# Patient Record
Sex: Male | Born: 1945 | Race: Black or African American | Hispanic: No | Smoking: Former smoker
Health system: Southern US, Community
[De-identification: ages and names within clinical notes are randomized; demographics above are authoritative.]

## PROBLEM LIST (undated history)

## (undated) DIAGNOSIS — N138 Other obstructive and reflux uropathy: Secondary | ICD-10-CM

## (undated) DIAGNOSIS — I10 Essential (primary) hypertension: Secondary | ICD-10-CM

## (undated) DIAGNOSIS — N401 Enlarged prostate with lower urinary tract symptoms: Secondary | ICD-10-CM

## (undated) DIAGNOSIS — C61 Malignant neoplasm of prostate: Secondary | ICD-10-CM

## (undated) DIAGNOSIS — C801 Malignant (primary) neoplasm, unspecified: Secondary | ICD-10-CM

## (undated) DIAGNOSIS — Z86010 Personal history of colonic polyps: Secondary | ICD-10-CM

## (undated) DIAGNOSIS — K219 Gastro-esophageal reflux disease without esophagitis: Secondary | ICD-10-CM

## (undated) DIAGNOSIS — M1990 Unspecified osteoarthritis, unspecified site: Secondary | ICD-10-CM

## (undated) HISTORY — DX: Personal history of colonic polyps: Z86.010

## (undated) HISTORY — DX: Essential (primary) hypertension: I10

## (undated) HISTORY — DX: Gastro-esophageal reflux disease without esophagitis: K21.9

## (undated) HISTORY — PX: PROSTATE BIOPSY: SHX241

## (undated) HISTORY — PX: MULTIPLE TOOTH EXTRACTIONS: SHX2053

## (undated) HISTORY — DX: Malignant (primary) neoplasm, unspecified: C80.1

## (undated) HISTORY — PX: TONSILLECTOMY AND ADENOIDECTOMY: SUR1326

## (undated) HISTORY — DX: Benign prostatic hyperplasia with lower urinary tract symptoms: N13.8

## (undated) HISTORY — DX: Benign prostatic hyperplasia with lower urinary tract symptoms: N40.1

---

## 2000-07-16 ENCOUNTER — Encounter: Admission: RE | Admit: 2000-07-16 | Discharge: 2000-07-16 | Payer: Self-pay | Admitting: Family Medicine

## 2000-11-19 ENCOUNTER — Encounter: Admission: RE | Admit: 2000-11-19 | Discharge: 2000-11-19 | Payer: Self-pay | Admitting: Family Medicine

## 2000-12-22 ENCOUNTER — Encounter: Admission: RE | Admit: 2000-12-22 | Discharge: 2000-12-22 | Payer: Self-pay | Admitting: Family Medicine

## 2001-05-06 ENCOUNTER — Encounter: Admission: RE | Admit: 2001-05-06 | Discharge: 2001-05-06 | Payer: Self-pay | Admitting: Family Medicine

## 2006-07-28 ENCOUNTER — Emergency Department (HOSPITAL_COMMUNITY): Admission: EM | Admit: 2006-07-28 | Discharge: 2006-07-28 | Payer: Self-pay | Admitting: Emergency Medicine

## 2007-04-03 ENCOUNTER — Emergency Department (HOSPITAL_COMMUNITY): Admission: EM | Admit: 2007-04-03 | Discharge: 2007-04-03 | Payer: Self-pay | Admitting: *Deleted

## 2007-07-01 ENCOUNTER — Emergency Department (HOSPITAL_COMMUNITY): Admission: EM | Admit: 2007-07-01 | Discharge: 2007-07-01 | Payer: Self-pay | Admitting: Emergency Medicine

## 2007-08-12 ENCOUNTER — Ambulatory Visit: Payer: Self-pay | Admitting: Family Medicine

## 2007-08-12 ENCOUNTER — Encounter (INDEPENDENT_AMBULATORY_CARE_PROVIDER_SITE_OTHER): Payer: Self-pay | Admitting: Family Medicine

## 2007-08-12 DIAGNOSIS — N138 Other obstructive and reflux uropathy: Secondary | ICD-10-CM | POA: Insufficient documentation

## 2007-08-12 DIAGNOSIS — N401 Enlarged prostate with lower urinary tract symptoms: Secondary | ICD-10-CM

## 2007-08-12 DIAGNOSIS — I10 Essential (primary) hypertension: Secondary | ICD-10-CM

## 2007-08-12 LAB — CONVERTED CEMR LAB
ALT: 19 units/L (ref 0–53)
AST: 15 units/L (ref 0–37)
Albumin: 4.4 g/dL (ref 3.5–5.2)
Alkaline Phosphatase: 79 units/L (ref 39–117)
BUN: 8 mg/dL (ref 6–23)
CO2: 28 meq/L (ref 19–32)
Calcium: 9.1 mg/dL (ref 8.4–10.5)
Chloride: 106 meq/L (ref 96–112)
Cholesterol: 172 mg/dL (ref 0–200)
Creatinine, Ser: 1.03 mg/dL (ref 0.40–1.50)
Glucose, Bld: 95 mg/dL (ref 70–99)
HDL: 49 mg/dL (ref >39)
LDL Cholesterol: 112 mg/dL — ABNORMAL HIGH (ref 0–99)
PSA: 5.24 ng/mL — ABNORMAL HIGH (ref 0.10–4.00)
PSA: ABNORMAL ng/mL
Potassium: 4.5 meq/L (ref 3.5–5.3)
Sodium: 143 meq/L (ref 135–145)
Total Bilirubin: 0.6 mg/dL (ref 0.3–1.2)
Total CHOL/HDL Ratio: 3.5
Total Protein: 6.3 g/dL (ref 6.0–8.3)
Triglycerides: 53 mg/dL (ref <150)
VLDL: 11 mg/dL (ref 0–40)

## 2007-09-16 ENCOUNTER — Ambulatory Visit (HOSPITAL_COMMUNITY): Admission: RE | Admit: 2007-09-16 | Discharge: 2007-09-16 | Payer: Self-pay | Admitting: Family Medicine

## 2007-09-16 ENCOUNTER — Encounter (INDEPENDENT_AMBULATORY_CARE_PROVIDER_SITE_OTHER): Payer: Self-pay | Admitting: Family Medicine

## 2007-09-16 ENCOUNTER — Ambulatory Visit: Payer: Self-pay | Admitting: Family Medicine

## 2007-09-16 LAB — CONVERTED CEMR LAB
BUN: 8 mg/dL (ref 6–23)
CO2: 25 meq/L (ref 19–32)
Calcium: 9.3 mg/dL (ref 8.4–10.5)
Chloride: 104 meq/L (ref 96–112)
Cholesterol, target level: 200 mg/dL
Creatinine, Ser: 1.01 mg/dL (ref 0.40–1.50)
Glucose, Bld: 88 mg/dL (ref 70–99)
HDL goal, serum: 40 mg/dL
LDL Goal: 130 mg/dL
Potassium: 3.7 meq/L (ref 3.5–5.3)
Sodium: 142 meq/L (ref 135–145)

## 2007-09-21 ENCOUNTER — Telehealth (INDEPENDENT_AMBULATORY_CARE_PROVIDER_SITE_OTHER): Payer: Self-pay | Admitting: Family Medicine

## 2007-09-23 ENCOUNTER — Telehealth (INDEPENDENT_AMBULATORY_CARE_PROVIDER_SITE_OTHER): Payer: Self-pay | Admitting: *Deleted

## 2007-09-28 ENCOUNTER — Encounter (INDEPENDENT_AMBULATORY_CARE_PROVIDER_SITE_OTHER): Payer: Self-pay | Admitting: Family Medicine

## 2007-10-24 ENCOUNTER — Encounter (INDEPENDENT_AMBULATORY_CARE_PROVIDER_SITE_OTHER): Payer: Self-pay | Admitting: Family Medicine

## 2007-10-24 ENCOUNTER — Ambulatory Visit: Payer: Self-pay | Admitting: Family Medicine

## 2007-10-24 LAB — CONVERTED CEMR LAB
BUN: 8 mg/dL (ref 6–23)
CO2: 25 meq/L (ref 19–32)
Calcium: 9.4 mg/dL (ref 8.4–10.5)
Chloride: 104 meq/L (ref 96–112)
Creatinine, Ser: 0.94 mg/dL (ref 0.40–1.50)
Glucose, Bld: 88 mg/dL (ref 70–99)
Microalbumin U total vol: NEGATIVE mg/L
Potassium: 3.9 meq/L (ref 3.5–5.3)
Sodium: 141 meq/L (ref 135–145)

## 2007-11-09 ENCOUNTER — Ambulatory Visit: Payer: Self-pay | Admitting: Family Medicine

## 2007-11-13 ENCOUNTER — Emergency Department (HOSPITAL_COMMUNITY): Admission: EM | Admit: 2007-11-13 | Discharge: 2007-11-13 | Payer: Self-pay | Admitting: Emergency Medicine

## 2007-11-18 ENCOUNTER — Ambulatory Visit: Payer: Self-pay | Admitting: Family Medicine

## 2008-02-24 ENCOUNTER — Ambulatory Visit: Payer: Self-pay | Admitting: Family Medicine

## 2008-03-23 ENCOUNTER — Ambulatory Visit: Payer: Self-pay | Admitting: Family Medicine

## 2008-05-30 ENCOUNTER — Encounter (INDEPENDENT_AMBULATORY_CARE_PROVIDER_SITE_OTHER): Payer: Self-pay | Admitting: Family Medicine

## 2008-08-29 ENCOUNTER — Encounter (INDEPENDENT_AMBULATORY_CARE_PROVIDER_SITE_OTHER): Payer: Self-pay | Admitting: Family Medicine

## 2008-08-29 ENCOUNTER — Ambulatory Visit: Payer: Self-pay | Admitting: Family Medicine

## 2008-08-30 ENCOUNTER — Encounter (INDEPENDENT_AMBULATORY_CARE_PROVIDER_SITE_OTHER): Payer: Self-pay | Admitting: Family Medicine

## 2008-08-30 DIAGNOSIS — E785 Hyperlipidemia, unspecified: Secondary | ICD-10-CM | POA: Insufficient documentation

## 2008-08-30 LAB — CONVERTED CEMR LAB
Calcium: 9.3 mg/dL (ref 8.4–10.5)
Cholesterol: 221 mg/dL — ABNORMAL HIGH (ref 0–200)
Potassium: 3.6 meq/L (ref 3.5–5.3)
Sodium: 141 meq/L (ref 135–145)
Total CHOL/HDL Ratio: 4.4
Triglycerides: 76 mg/dL (ref <150)
VLDL: 15 mg/dL (ref 0–40)

## 2009-01-03 ENCOUNTER — Telehealth (INDEPENDENT_AMBULATORY_CARE_PROVIDER_SITE_OTHER): Payer: Self-pay | Admitting: Family Medicine

## 2009-02-03 ENCOUNTER — Emergency Department (HOSPITAL_COMMUNITY): Admission: EM | Admit: 2009-02-03 | Discharge: 2009-02-03 | Payer: Self-pay | Admitting: Emergency Medicine

## 2009-02-27 ENCOUNTER — Ambulatory Visit: Payer: Self-pay | Admitting: Family Medicine

## 2009-02-27 DIAGNOSIS — R972 Elevated prostate specific antigen [PSA]: Secondary | ICD-10-CM | POA: Insufficient documentation

## 2009-03-13 ENCOUNTER — Encounter (INDEPENDENT_AMBULATORY_CARE_PROVIDER_SITE_OTHER): Payer: Self-pay | Admitting: Family Medicine

## 2009-03-15 ENCOUNTER — Telehealth (INDEPENDENT_AMBULATORY_CARE_PROVIDER_SITE_OTHER): Payer: Self-pay | Admitting: Family Medicine

## 2009-06-06 ENCOUNTER — Telehealth: Payer: Self-pay | Admitting: Family Medicine

## 2009-06-07 ENCOUNTER — Telehealth: Payer: Self-pay | Admitting: Family Medicine

## 2009-06-12 ENCOUNTER — Ambulatory Visit: Payer: Self-pay | Admitting: Family Medicine

## 2009-09-20 ENCOUNTER — Ambulatory Visit: Payer: Self-pay | Admitting: Family Medicine

## 2009-12-15 ENCOUNTER — Emergency Department (HOSPITAL_COMMUNITY): Admission: EM | Admit: 2009-12-15 | Discharge: 2009-12-15 | Payer: Self-pay | Admitting: Emergency Medicine

## 2009-12-30 ENCOUNTER — Encounter: Payer: Self-pay | Admitting: Family Medicine

## 2009-12-30 ENCOUNTER — Ambulatory Visit: Payer: Self-pay | Admitting: Family Medicine

## 2010-01-01 LAB — CONVERTED CEMR LAB
BUN: 8 mg/dL (ref 6–23)
Creatinine, Ser: 1.03 mg/dL (ref 0.40–1.50)
Direct LDL: 134 mg/dL — ABNORMAL HIGH
Glucose, Bld: 82 mg/dL (ref 70–99)
Potassium: 3.3 meq/L — ABNORMAL LOW (ref 3.5–5.3)

## 2010-01-06 ENCOUNTER — Encounter: Payer: Self-pay | Admitting: Family Medicine

## 2010-05-29 ENCOUNTER — Encounter: Payer: Self-pay | Admitting: Family Medicine

## 2010-05-29 ENCOUNTER — Emergency Department (HOSPITAL_COMMUNITY): Admission: EM | Admit: 2010-05-29 | Discharge: 2010-05-29 | Payer: Self-pay | Admitting: Emergency Medicine

## 2010-05-30 ENCOUNTER — Ambulatory Visit: Payer: Self-pay | Admitting: Family Medicine

## 2010-05-30 ENCOUNTER — Encounter: Payer: Self-pay | Admitting: Family Medicine

## 2010-05-30 DIAGNOSIS — E876 Hypokalemia: Secondary | ICD-10-CM | POA: Insufficient documentation

## 2010-05-30 LAB — CONVERTED CEMR LAB
CO2: 28 meq/L (ref 19–32)
Calcium: 9.5 mg/dL (ref 8.4–10.5)
Creatinine, Ser: 0.99 mg/dL (ref 0.40–1.50)
Glucose, Bld: 79 mg/dL (ref 70–99)
Sodium: 140 meq/L (ref 135–145)

## 2010-06-04 ENCOUNTER — Ambulatory Visit: Payer: Self-pay | Admitting: Family Medicine

## 2010-08-22 ENCOUNTER — Encounter: Payer: Self-pay | Admitting: Family Medicine

## 2010-09-19 ENCOUNTER — Ambulatory Visit: Payer: Self-pay | Admitting: Family Medicine

## 2010-11-19 ENCOUNTER — Encounter: Payer: Self-pay | Admitting: Family Medicine

## 2010-11-26 ENCOUNTER — Ambulatory Visit: Payer: Self-pay | Admitting: Family Medicine

## 2010-11-26 ENCOUNTER — Encounter: Payer: Self-pay | Admitting: Family Medicine

## 2010-11-26 LAB — CONVERTED CEMR LAB

## 2010-11-27 LAB — CONVERTED CEMR LAB
ALT: 29 units/L (ref 0–53)
AST: 20 units/L (ref 0–37)
Alkaline Phosphatase: 77 units/L (ref 39–117)
BUN: 10 mg/dL (ref 6–23)
Chloride: 104 meq/L (ref 96–112)
Creatinine, Ser: 1.07 mg/dL (ref 0.40–1.50)
Potassium: 3.8 meq/L (ref 3.5–5.3)

## 2010-12-22 ENCOUNTER — Encounter: Payer: Self-pay | Admitting: Family Medicine

## 2011-01-06 NOTE — Assessment & Plan Note (Signed)
Summary: REMOVE SUTURES/KH  Nurse Visit patient in office to remove sutures that were placed on 05/29/2010. wound is over left eyebrow area. three sutures removed without any problem, cleaned with alcohol and antibiotic ointment appoiled.  Theresia Lo RN  June 04, 2010 10:19 AM   Allergies: 1)  ! Ace Inhibitors 2)  Cardizem  Orders Added: 1)  Est Level 1- [facility] [16109]

## 2011-01-06 NOTE — Consult Note (Signed)
Summary: [facility]   Imported By: Peter Lam 01/17/2010 11:23:22  _____________________________________________________________________  External Attachment:    Type:   Image     Comment:   External Document

## 2011-01-06 NOTE — Miscellaneous (Signed)
Summary: needs f/u appt  Clinical Lists Changes LM for him to call back. needs f/u on Friday.[place] RN  May 29, 2010 9:30 AM spoke with him. has appt tomorrow with Dr. Georgiana Shore at [facility] RN  May 29, 2010 4:09 PM

## 2011-01-06 NOTE — Assessment & Plan Note (Signed)
Summary: f/up,tcb   Vital Signs:  Patient profile:   65 year old male Height:      68.5 inches Weight:      178.5 pounds BMI:     26.84 Pulse rate:   88 / minute BP sitting:   119 / 82  (right arm)  Vitals Entered By: Arlyss Repress CMA, (December 30, 2009 8:47 AM) CC: f/up HTN Is Patient Diabetic? No Pain Assessment Patient in pain? no        Primary Care Provider:  Ardeen Garland  MD  CC:  f/up HTN.  History of Present Illness: Peter Lam comes in today for follow-up of HTN and BPH. 1) HTN - on atenolol-chorthalidone.  BP doing well.  No complaints of highs or lows.  Tolerates the medication well.  No chest pain or shortness of breath or vision changes.   2) BPH - follows with Dr. Annabell Howells of Urology.  Has appt with him 01/08/10.  On doxazosin and doing well.  Did go to the ED about 1 month ago for an episode of urinary retention, but once he was there he was urinating very well and so they didn't really do anything and sent him home.  Thinks he just wasn't drinking enough.  No further problems since.  Has had biopsy by Dr. Annabell Howells of his prostate and per patient, it was normal.   Habits & Providers  Alcohol-Tobacco-Diet     Tobacco Status: quit  Current Medications (verified): 1)  Anacin 81 Mg  Tbec (Aspirin) .Marland Kitchen.. 1 Tablet Daily 2)  Atenolol-Chlorthalidone 50-25 Mg  Tabs (Atenolol-Chlorthalidone) .... One Tablet Daily For Blood Pressure Control 3)  Doxazosin Mesylate 4 Mg Tabs (Doxazosin Mesylate) .... 1/2 Tablet By Mouth Every Day  Allergies: 1)  ! Ace Inhibitors 2)  Cardizem  Physical Exam  General:  alert, in NAD vitals reviewed. Eyes:  pupils equal, pupils round, corneas and lenses clear, and no injection.   Mouth:  pharynx pink and moist.   Lungs:  Normal respiratory effort, chest expands symmetrically. Lungs are clear to auscultation, no crackles or wheezes. Heart:  Normal rate and regular rhythm. S1 and S2 normal without gallop, murmur, click, rub or other extra  sounds. 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  No changes necessary.  His updated medication list for this problem includes:    Atenolol-chlorthalidone 50-25 Mg Tabs (Atenolol-chlorthalidone) ..... One tablet daily for blood pressure control    Doxazosin Mesylate 4 Mg Tabs (Doxazosin mesylate) .Marland Kitchen... 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  Sees urology next week.  No changes.   Orders: [facility]- Est Level  3 (88416)  Complete Medication List: 1)  Anacin 81 Mg Tbec (Aspirin) .Marland Kitchen.. 1 tablet daily 2)  Atenolol-chlorthalidone 50-25 Mg Tabs (Atenolol-chlorthalidone) .... One tablet daily for blood pressure control 3)  Doxazosin Mesylate 4 Mg Tabs (Doxazosin mesylate) .... 1/2 tablet by mouth every day  Other Orders: Direct LDL-[facility] (60630-16010)  Prevention & Chronic Care Immunizations   Influenza vaccine: Fluvax Non-MCR  (09/20/2009)   Influenza vaccine due: 08/29/2009    Tetanus booster: Not documented    Pneumococcal vaccine: Not documented    H. zoster vaccine: Not documented  Colorectal Screening   Hemoccult: Done.  (12/07/2000)   Hemoccult due: Not Indicated    Colonoscopy: normal  (09/15/2004)   Colonoscopy due: 09/2014  Other Screening   PSA: 5.24  (08/12/2007)   PSA due due: 08/11/2008   Smoking status: quit  (12/30/2009)  Lipids   Total Cholesterol: 221  (08/29/2008)   LDL: 156  (08/29/2008)   LDL Direct: Not documented   HDL: 50  (08/29/2008)   Triglycerides: 76  (08/29/2008)    SGOT (AST): 15  (08/12/2007)   SGPT (ALT): 19  (08/12/2007)   Alkaline phosphatase: 79  (08/12/2007)   Total bilirubin: 0.6   (08/12/2007)    Lipid flowsheet reviewed?: Yes   Progress toward LDL goal: Unchanged  Hypertension   Last Blood Pressure: 119 / 82  (12/30/2009)   Serum creatinine: 1.06  (08/29/2008)   Serum potassium 3.6  (08/29/2008)    Hypertension flowsheet reviewed?: Yes   Progress toward BP goal: At goal  Self-Management Support :   Personal Goals (by the next clinic visit) :      Personal blood pressure goal: 140/90  (12/30/2009)     Personal LDL goal: 130  (12/30/2009)    Hypertension self-management support: Not documented    Lipid self-management support: Not documented

## 2011-01-06 NOTE — Assessment & Plan Note (Signed)
Summary: f/u [facility] ED,df   Vital Signs:  Patient profile:   65 year old male Height:      68.5 inches Weight:      174 pounds BMI:     26.17 Temp:     98.4 degrees F oral Pulse rate:   78 / minute BP sitting:   122 / 79  (left arm)  Vitals Entered By: Tessie Fass CMA (May 30, 2010 8:39 AM) CC: hospital f/u Is Patient Diabetic? No Pain Assessment Patient in pain? no        Primary Care Provider:  Ardeen Garland  MD  CC:  hospital f/u.  History of Present Illness: Mr. Pallas comes in today to followup an ED visit at [facility] yesterday.  NOte documented in extra detail to serve as patietn summary for next primary provider.  1) ED - fell - "near syncope"- yesterday morning.  WEnt to the bathroom, got dizzy, and so was trying to go back to bed and felt very light headed so went down on his knees quickly and hit his head, just above his right eye.  Never actually lost consciousness.  QUickly felt better after sitting down but had to go to ED because his wound kept bleeding.  Got 3 stitches.  Found to also have low K.  He feels the doxazosin makes him dizzy when he takes it.  Takes it for urinary retention.  Didn't take it last night and has been fine today. Wants to know if he can take it only as needed. 2) Low K - ED records reviewed.  K was 3.0.  He is on chlorthalindone as he has been very stable on this BP wise for years.   3) Urinary retentin - follows with urology for BPH. Most recent prostate biopsy negative for cancer.  Given doxazosin to help.  Wants to take it as needed as described earlier 4) BP - on Atenolol - chlorthalindone because he was so stable on it.  Continues to have well-controlled BP on this regimen.   Current Medications (verified): 1)  Anacin 81 Mg  Tbec (Aspirin) .Marland Kitchen.. 1 Tablet Daily 2)  Atenolol-Chlorthalidone 50-25 Mg  Tabs (Atenolol-Chlorthalidone) .... One Tablet Daily For Blood Pressure Control 3)  Doxazosin Mesylate 4 Mg Tabs (Doxazosin Mesylate) .... 1/2 Tablet  By Mouth Every Day  Allergies: 1)  ! Ace Inhibitors 2)  Cardizem  Past History:  Past Medical History: Last updated: 02/27/2009 BPH with obstructive sympotms- followed by Dr. Annabell Howells at [facility]  Family History: Last updated: 09-15-2008 Brother with htn Father died of trauma at age 51. He had htn Mother - died at 100 with Alzheimers.  Social History: Last updated: 02/27/2009 Lives with wife, [name], aged 70. They have been married for 22 years.  Has one stepson.  Does not drink alcohol.  Former smoker.  Owns car, but wife does most of the driving.  Walks for exercise- walks for 4 hours around the parking lot while his wife gets dialysis three times a week.  Physical Exam  General:  alert, in NAD vitals reviewed. Head:  approx 1.5 cm linear wound above right eyebrown with 3 sutures in place. Eyes:  conjunctiva clear and moist Lungs:  Normal respiratory effort, chest expands symmetrically. Lungs are clear to auscultation, no crackles or wheezes. Heart:  Normal rate and regular rhythm. S1 and S2 normal without gallop, murmur, click, rub or other extra sounds. Pulses:  2+ radial and dp pulses Extremities:  no edema  Impression & Recommendations:  Problem # 1:  SYNCOPE AND COLLAPSE (ICD-780.2) Assessment New  Near syncope - sounds vasovagal given he was going to the bathroom and had prodromal symptoms.  Doxazosin could be making him orthostatic though as well.  SEe below for plan for doxazosin.  He will return to RN clinic on Wed to have sutures removed.   Orders: [facility]- Est  Level 4 (16109)  Problem # 2:  HYPOKALEMIA (ICD-276.8) Assessment: New  Recheck BMET today.  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  Await BMET results and consider changing to ACE-I for BP control.  His updated  medication list for this problem includes:    Atenolol-chlorthalidone 50-25 Mg Tabs (Atenolol-chlorthalidone) ..... One tablet daily for blood pressure control    Doxazosin Mesylate 4 Mg Tabs (Doxazosin mesylate) .Marland Kitchen... 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   Orders: [facility]- Est  Level 4 (13086)  Problem # 5:  HYPERLIPIDEMIA (ICD-272.4) Wanted to try dietary modification before starting statin.  Last LDL 134.  Goal is at least <130, but ideally < 100.  Needs recheck at next appt.   Complete Medication List: 1)  Anacin 81 Mg Tbec (Aspirin) .Marland Kitchen.. 1 tablet daily 2)  Atenolol-chlorthalidone 50-25 Mg Tabs (Atenolol-chlorthalidone) .... One tablet daily for blood pressure control 3)  Doxazosin Mesylate 4 Mg Tabs (Doxazosin mesylate) .... 1/2 tablet by mouth every day  Patient Instructions: 1)  Please return on Wednesday to our nurse clinic to have your stitches removed.  2)  Please return in 6 months to meet your new doctor and check your blood pressure.

## 2011-01-06 NOTE — Consult Note (Signed)
Summary: [facility]   Imported By: Clydell Hakim 08/28/2010 15:44:27  _____________________________________________________________________  External Attachment:    Type:   Image     Comment:   External Document  Appended Document: [facility]  change meds by urology   Clinical Lists Changes  Medications: Removed medication of DOXAZOSIN MESYLATE 4 MG TABS (DOXAZOSIN MESYLATE) 1/2 tablet by mouth every day Added new medication of TAMSULOSIN HCL 0.4 MG CAPS (TAMSULOSIN HCL) cap daily

## 2011-01-06 NOTE — Assessment & Plan Note (Signed)
Summary: flu shot/[facility]  Nurse Visit   Vital Signs:  Patient profile:   65 year old male Temp:     98.3 degrees F  Vitals Entered By: Theresia Lo RN (September 19, 2010 11:14 AM)  Allergies: 1)  ! Ace Inhibitors 2)  Cardizem  Immunizations Administered:  Influenza Vaccine # 1:    Vaccine Type: Fluvax 3+    Site: left deltoid    Mfr: [employer]    Dose: 0.5 ml    Route: IM    Given by: Theresia Lo RN    Exp. Date: 06/03/2011    Lot #: [id number]    VIS given: 07/01/10 version given September 19, 2010.  Flu Vaccine Consent Questions:    Do you have a history of severe allergic reactions to this vaccine? no    Any prior history of allergic reactions to egg and/or gelatin? no    Do you have a sensitivity to the preservative Thimersol? no    Do you have a past history of Guillan-Barre Syndrome? no    Do you currently have an acute febrile illness? no    Have you ever had a severe reaction to latex? no    Vaccine information given and explained to patient? yes  Orders Added: 1)  Flu Vaccine 70yrs + [90658] 2)  Admin 1st Vaccine [09811]

## 2011-01-08 NOTE — Consult Note (Signed)
Summary: [facility]   Imported By: De Nurse 11/26/2010 11:52:26  _____________________________________________________________________  External Attachment:    Type:   Image     Comment:   External Document

## 2011-01-08 NOTE — Assessment & Plan Note (Signed)
Summary: FLU VISIT/[facility]   Vital Signs:  Patient profile:   65 year old male Weight:      178 pounds Temp:     98.5 degrees F oral Pulse rate:   93 / minute Pulse rhythm:   regular BP sitting:   138 / 86  (left arm) Cuff size:   regular  Vitals Entered By: Loralee Pacas CMA (November 26, 2010 2:33 PM)  Primary Care Provider:  Antoine Primas DO   History of Present Illness: 65 yo male here fo  1.htn BP today:138/80 Taking Meds:yes but not today ran out of medicatoin Side Effects:no but when tried to get too tight of control pt was getting syncope.  ROS: denies Headache visual changes nausea vomiting abdominal pain numbness in extremities   2.  BPH-  Pt is followed by urology, doing well on flomax.  Pt has elevated PSA but has been stable the last 2 years. will see them again in january  3.  hyperlipdemia-  Pt has been trying to watch what he eats and trying to watch his weight. Pt doing well.    Current Medications (verified): 1)  Anacin 81 Mg  Tbec (Aspirin) .Marland Kitchen.. 1 Tablet Daily 2)  Atenolol-Chlorthalidone 50-25 Mg  Tabs (Atenolol-Chlorthalidone) .... One Tablet Daily For Blood Pressure Control 3)  Tamsulosin Hcl 0.4 Mg Caps (Tamsulosin Hcl) .... Cap Daily  Allergies (verified): 1)  ! Ace Inhibitors 2)  Cardizem  Past History:  Past medical, surgical, family and social histories (including risk factors) reviewed, and no changes noted (except as noted below).  Past Medical History: Reviewed history from 02/27/2009 and no changes required. BPH with obstructive sympotms- followed by Dr. Annabell Howells at [facility]  Family History: Reviewed history from 08/29/2008 and no changes required. Brother with htn Father died of trauma at age 76. He had htn Mother - died at 21 with Alzheimers.  Social History: Reviewed history from 02/27/2009 and no changes required. Lives with wife, Peter Lam, aged 65. They have been married for 22 years.  Has one stepson.  Does not drink  alcohol.  Former smoker.  Owns car, but wife does most of the driving.  Walks for exercise- walks for 4 hours around the parking lot while his wife gets dialysis three times a week.  Review of Systems       denies fever, chills, nausea, vomiting, diarrhea or constipation pt has been very stressed due to his wife illness.   Physical Exam  General:  alert, in NAD vitals reviewed. Eyes:  conjunctiva clear and moist Mouth:  pharynx pink and moist.  Very poor detition Lungs:  Normal respiratory effort, chest expands symmetrically. Lungs are clear to auscultation, no crackles or wheezes. Heart:  Normal rate and regular rhythm. S1 and S2 normal without gallop, murmur, click, rub or other extra sounds. 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 Will get labs His updated medication list for this problem includes:    Atenolol-chlorthalidone 50-25 Mg Tabs (Atenolol-chlorthalidone) ..... One tablet daily for blood pressure control  Orders: Comp Met-[facility] (81191-47829) Direct LDL-[facility] (56213-08657) [facility]- Est  Level 4 (84696)  Problem # 2:  HYPERLIPIDEMIA (ICD-272.4) will get direct LDL and monitor.  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  [facility]- Est  Level 4 (99214)  Complete Medication List: 1)  Anacin 81 Mg Tbec (Aspirin) .Marland Kitchen.. 1 tablet daily 2)  Atenolol-chlorthalidone 50-25 Mg Tabs (Atenolol-chlorthalidone) .... One tablet daily for blood pressure control 3)  Tamsulosin Hcl 0.4 Mg Caps (Tamsulosin hcl) .... Cap daily  Patient Instructions: 1)  good to see you 2)  We will get labs today and call you with the results 3)  Your  blood pressure is a little high but doing well 4)  I want to see you againin 3 months.  Prescriptions: ATENOLOL-CHLORTHALIDONE 50-25 MG  TABS (ATENOLOL-CHLORTHALIDONE) One tablet daily for blood pressure control  #90 x 3   Entered and Authorized by:   Antoine Primas DO   Signed by:   Antoine Primas DO on 11/26/2010   Method used:   Electronically to        [facility] # [phone]* (retail)       [address] [place], [place]  [address]       Ph: [phone]       Fax: [phone]   RxID:   [id number]    Orders Added: 1)  Comp Met-[facility] [24401-02725] 2)  Direct LDL-[facility] [36644-03474] 3)  [facility]- Est  Level 4 [25956]    Prevention & Chronic Care Immunizations   Influenza vaccine: Fluvax 3+  (09/19/2010)   Influenza vaccine due: 08/29/2009    Tetanus booster: Not documented    Pneumococcal vaccine: Not documented    H. zoster vaccine: Not documented  Colorectal Screening   Hemoccult: Done.  (12/07/2000)   Hemoccult due: Not Indicated    Colonoscopy: normal  (09/15/2004)   Colonoscopy due: 09/2014  Other Screening   PSA: 5.24  (08/12/2007)   PSA action/deferral: Not indicated  (11/26/2010)   PSA due due: 08/11/2008   Smoking status: quit  (12/30/2009)  Lipids   Total Cholesterol: 221  (08/29/2008)   LDL: 156  (08/29/2008)   LDL Direct: 134  (12/30/2009)   HDL: 50  (08/29/2008)   Triglycerides: 76  (08/29/2008)    SGOT (AST): 15  (08/12/2007)   SGPT (ALT): 19  (08/12/2007) CMP ordered    Alkaline phosphatase: 79  (08/12/2007)   Total bilirubin: 0.6  (08/12/2007)    Lipid flowsheet reviewed?: Yes   Progress toward LDL goal: Unchanged    Stage of readiness to change (lipid management): Action  Hypertension   Last Blood Pressure: 138 / 86  (11/26/2010)   Serum creatinine: 0.99  (05/30/2010)   Serum potassium 3.9  (05/30/2010) CMP ordered     Hypertension flowsheet reviewed?: Yes   Progress toward BP goal: At goal    Stage of readiness to  change (hypertension management): Maintenance  Self-Management Support :   Personal Goals (by the next clinic visit) :      Personal blood pressure goal: 140/90  (12/30/2009)     Personal LDL goal: 130  (12/30/2009)    Hypertension self-management support: Not documented    Lipid self-management support: Not documented

## 2011-01-08 NOTE — Consult Note (Signed)
Summary: [facility]   Imported By: De Nurse 12/22/2010 15:52:08  _____________________________________________________________________  External Attachment:    Type:   Image     Comment:   External Document

## 2011-01-10 ENCOUNTER — Encounter: Payer: Self-pay | Admitting: *Deleted

## 2011-02-22 LAB — URINE CULTURE
Colony Count: NO GROWTH
Culture: NO GROWTH

## 2011-02-22 LAB — DIFFERENTIAL
Lymphs Abs: 1.1 10*3/uL (ref 0.7–4.0)
Monocytes Relative: 6 % (ref 3–12)
Neutro Abs: 8.5 10*3/uL — ABNORMAL HIGH (ref 1.7–7.7)
Neutrophils Relative %: 83 % — ABNORMAL HIGH (ref 43–77)

## 2011-02-22 LAB — CBC
Hemoglobin: 15.9 g/dL (ref 13.0–17.0)
RBC: 5.01 MIL/uL (ref 4.22–5.81)

## 2011-02-22 LAB — POCT CARDIAC MARKERS
CKMB, poc: 1.5 ng/mL (ref 1.0–8.0)
Troponin i, poc: <0.05 ng/mL (ref 0.00–0.09)

## 2011-02-22 LAB — POCT I-STAT, CHEM 8
BUN: 7 mg/dL (ref 6–23)
Chloride: 102 mEq/L (ref 96–112)
Creatinine, Ser: 1.1 mg/dL (ref 0.4–1.5)
Glucose, Bld: 106 mg/dL — ABNORMAL HIGH (ref 70–99)
Potassium: 3 mEq/L — ABNORMAL LOW (ref 3.5–5.1)

## 2011-02-22 LAB — URINALYSIS, ROUTINE W REFLEX MICROSCOPIC
Bilirubin Urine: NEGATIVE
Glucose, UA: NEGATIVE mg/dL
Hgb urine dipstick: NEGATIVE
Specific Gravity, Urine: 1.007 (ref 1.005–1.030)

## 2011-03-24 LAB — URINALYSIS, ROUTINE W REFLEX MICROSCOPIC
Bilirubin Urine: NEGATIVE
Nitrite: NEGATIVE
Specific Gravity, Urine: 1.009 (ref 1.005–1.030)
Urobilinogen, UA: 1 mg/dL (ref 0.0–1.0)

## 2011-04-03 ENCOUNTER — Other Ambulatory Visit: Payer: Self-pay | Admitting: Family Medicine

## 2011-04-03 MED ORDER — ATENOLOL-CHLORTHALIDONE 50-25 MG PO TABS: 1.0000 | ORAL_TABLET | Freq: Every day | ORAL | Status: DC

## 2011-04-22 ENCOUNTER — Other Ambulatory Visit: Payer: Self-pay | Admitting: Family Medicine

## 2011-04-22 NOTE — Telephone Encounter (Signed)
Refill request

## 2011-06-01 ENCOUNTER — Ambulatory Visit (INDEPENDENT_AMBULATORY_CARE_PROVIDER_SITE_OTHER): Payer: Self-pay | Admitting: Family Medicine

## 2011-06-01 ENCOUNTER — Encounter: Payer: Self-pay | Admitting: Family Medicine

## 2011-06-01 DIAGNOSIS — N401 Enlarged prostate with lower urinary tract symptoms: Secondary | ICD-10-CM

## 2011-06-01 DIAGNOSIS — I10 Essential (primary) hypertension: Secondary | ICD-10-CM

## 2011-06-01 MED ORDER — ATENOLOL-CHLORTHALIDONE 50-25 MG PO TABS: 1.0000 | ORAL_TABLET | Freq: Every day | ORAL | Status: DC

## 2011-06-01 NOTE — Assessment & Plan Note (Signed)
Stable pt does see urology will have pt follow up with them but no red flags.

## 2011-06-01 NOTE — Assessment & Plan Note (Signed)
Refilled meds, seems to be doing well overall. Will get labs in 6 months.

## 2011-06-01 NOTE — Progress Notes (Signed)
  Subjective:    Patient ID: Peter Lam, male    DOB: 1946-09-19, 65 y.o.   MRN: [id number]  HPI Pt is here for physical exam 1. Hypertension Blood pressure at home:120 SBP most of the time Blood pressure today: 127/89 Taking Meds:yes Side effects:no ROS: Denies headache visual changes nausea, vomiting, chest pain or abdominal pain or shortness of breath.  BPH: Doing well, no hesitancy, no problem keeping a stream no pain , no hematuria.   Pt declines a colonoscopy   Review of Systems Denies fever, chills, nausea vomiting abdominal pain, dysuria, chest pain, shortness of breath dyspnea on exertion or numbness in extremities Past medical history, social, surgical and family history all reviewed.       Objective:   Physical Exam BP 127/89  Pulse 87  Temp(Src) 98 F (36.7 C) (Oral)  Wt 172 lb (78.019 kg) General appearance: alert and cooperative Head: Normocephalic, without obvious abnormality, atraumatic Eyes: conjunctivae/corneas clear. PERRL, EOM's intact. Fundi benign. Neck: no adenopathy, no carotid bruit, supple, symmetrical, trachea midline and thyroid not enlarged, symmetric, no tenderness/mass/nodules Lungs: clear to auscultation bilaterally Heart: regular rate and rhythm, S1, S2 normal, no murmur, click, rub or gallop Abdomen: soft, non-tender; bowel sounds normal; no masses,  no organomegaly Extremities: extremities normal, atraumatic, no cyanosis or edema Pulses: 2+ and symmetric trace ankle swelling.  Neurologic: Grossly normal       Assessment & Plan:

## 2011-06-01 NOTE — Patient Instructions (Signed)
You are doing great Just refilled you medication We will see you again after December and will get labs

## 2011-08-19 ENCOUNTER — Other Ambulatory Visit: Payer: Self-pay | Admitting: Family Medicine

## 2011-08-19 NOTE — Telephone Encounter (Signed)
Refill request

## 2011-08-28 ENCOUNTER — Encounter: Payer: Self-pay | Admitting: Family Medicine

## 2011-08-28 ENCOUNTER — Ambulatory Visit (INDEPENDENT_AMBULATORY_CARE_PROVIDER_SITE_OTHER): Payer: Self-pay | Admitting: Family Medicine

## 2011-08-28 DIAGNOSIS — Z23 Encounter for immunization: Secondary | ICD-10-CM

## 2011-08-28 DIAGNOSIS — K4090 Unilateral inguinal hernia, without obstruction or gangrene, not specified as recurrent: Secondary | ICD-10-CM | POA: Insufficient documentation

## 2011-08-28 NOTE — Progress Notes (Signed)
  Subjective:    Patient ID: Peter Lam, male    DOB: Jun 06, 1946, 65 y.o.   MRN: [id number]  HPI Patient is here accompanied with wife. Patient does want me to look at what is supposed to be an abdominal hernia. Patient states that he was at his urologist following up for his benign prostatic hypertrophy and found to have a hernia. Patient states that he has had no pain no change in bowel or bladder movements states that this hernia sometimes used to go in but now he usually stays out. Does not seem to bother him patient states he has no insurance at this time is wondering what the best thing to do.   Review of Systems Denies fever, chills, nausea vomiting abdominal pain, dysuria, chest pain, shortness of breath dyspnea on exertion or numbness in extremities Past medical history, social, surgical and family history all reviewed.     Objective:   Physical Exam Gen: NAD Abd: Bowel sounds are positive in all 4 quadrants patient is nontender patient though does have a left inguinal hernia approximately 3 x 2 cm partially able to be reduced without any pain. Patient's cremaster reflex intact bilaterally     Assessment & Plan:

## 2011-08-28 NOTE — Assessment & Plan Note (Signed)
Patient has a left-sided inguinal hernia at this time. Patient though seems to be doing well and is asymptomatic. Patient does not have any insurance and wants to hold off on any type of surgical correction. The patient red flags to look out for signs of any type of strangulation such as severe abdominal pain diarrhea constipation urinary or any bladder trouble. Patient verbalized understanding and will follow up with me at his regular interval

## 2011-09-14 LAB — POCT CARDIAC MARKERS
Operator id: 284251
Troponin i, poc: <0.05

## 2011-09-14 LAB — I-STAT 8, (EC8 V) (CONVERTED LAB)
Bicarbonate: 26.6 — ABNORMAL HIGH
Glucose, Bld: 121 — ABNORMAL HIGH
Hemoglobin: 17.3 — ABNORMAL HIGH
Operator id: 284251
Sodium: 139
TCO2: 28

## 2011-12-04 ENCOUNTER — Encounter: Payer: Self-pay | Admitting: Family Medicine

## 2011-12-04 ENCOUNTER — Ambulatory Visit (INDEPENDENT_AMBULATORY_CARE_PROVIDER_SITE_OTHER): Payer: Self-pay | Admitting: Family Medicine

## 2011-12-04 DIAGNOSIS — I10 Essential (primary) hypertension: Secondary | ICD-10-CM

## 2011-12-04 DIAGNOSIS — K4090 Unilateral inguinal hernia, without obstruction or gangrene, not specified as recurrent: Secondary | ICD-10-CM

## 2011-12-04 DIAGNOSIS — N401 Enlarged prostate with lower urinary tract symptoms: Secondary | ICD-10-CM

## 2011-12-04 DIAGNOSIS — E785 Hyperlipidemia, unspecified: Secondary | ICD-10-CM

## 2011-12-04 DIAGNOSIS — N138 Other obstructive and reflux uropathy: Secondary | ICD-10-CM

## 2011-12-04 LAB — LDL CHOLESTEROL, DIRECT: Direct LDL: 124 mg/dL — ABNORMAL HIGH

## 2011-12-04 LAB — COMPREHENSIVE METABOLIC PANEL
ALT: 26 U/L (ref 0–53)
AST: 23 U/L (ref 0–37)
Alkaline Phosphatase: 71 U/L (ref 39–117)
Chloride: 104 mEq/L (ref 96–112)
Creat: 0.85 mg/dL (ref 0.50–1.35)
Total Bilirubin: 0.6 mg/dL (ref 0.3–1.2)

## 2011-12-04 MED ORDER — ATENOLOL-CHLORTHALIDONE 50-25 MG PO TABS: 1.0000 | ORAL_TABLET | Freq: Every day | ORAL | Status: DC

## 2011-12-04 NOTE — Assessment & Plan Note (Signed)
We'll do direct LDL today monitor on annual basis.

## 2011-12-04 NOTE — Assessment & Plan Note (Signed)
Patient has had this now for 3 months time left-sided continues to grow warned patient of the potential for incarceration. At this point patient is doing well has an appointment in the next 2 weeks with a surgeon patient does not have any insurance which makes him concerned but told him at this point how fast this is growing he should be taking care of in the near future. Pt will see Dr. Gerrit Friends soon.

## 2011-12-04 NOTE — Patient Instructions (Signed)
It is good to see you. I have refilled your blood pressure medication. Followup with Dr. Gerrit Friends for your hernia. You are cleared medically for surgery when it is necessary. I am going to get some labs today and call you with the results. Happy new year!

## 2011-12-04 NOTE — Progress Notes (Signed)
  Subjective:    Patient ID: Peter Lam, male    DOB: 25-Jul-1946, 65 y.o.   MRN: [id number]  HPI  Pt is here for followup 1. Hypertension Blood pressure at home:120 SBP most of the time Blood pressure today: 147/80 Taking Meds:yes Side effects:no ROS: Denies headache visual changes nausea, vomiting, chest pain or abdominal pain or shortness of breath.  BPH: Doing well, no hesitancy, no problem keeping a stream no pain , no hematuria. Patient follows up with a urologist on a regular basis.  Patient has been having an inguinal hernia followed for some time now. Patient states that it seems to be getting bigger does have a surgical consult with Dr. Gerrit Friends scheduled later this month. Patient denies much pain with it but is unable to have it go back into place his usual. Patient is a little concerned about not being able to lift much to take care of his wife during the recovery phase.   Review of Systems  Denies fever, chills, nausea vomiting abdominal pain, dysuria, chest pain, shortness of breath dyspnea on exertion or numbness in extremities Past medical history, social, surgical and family history all reviewed.     Objective:   Physical Exam  BP 147/80  Pulse 109  Temp(Src) 98.4 F (36.9 C) (Oral)  Ht 5' 8.5" (1.74 m)  Wt 173 lb 11.2 oz (78.79 kg)  BMI 26.03 kg/m2 General appearance: alert and cooperative Head: Normocephalic, without obvious abnormality, atraumatic Eyes: conjunctivae/corneas clear. PERRL, EOM's intact. Fundi benign. Neck: no adenopathy, no carotid bruit, supple, symmetrical, trachea midline and thyroid not enlarged, symmetric, no tenderness/mass/nodules Lungs: clear to auscultation bilaterally Heart: regular rate and rhythm, S1, S2 normal, no murmur, click, rub or gallop Abdomen: abnormal findings:  Patient has a significant inguinal left-sided hernia when patient lays down it is able to reproduce somewhat but not fully. Patient does not have any erythema  minimal tenderness bowel sounds are positive. Does not effect penis or scrotum.` Extremities: extremities normal, atraumatic, no cyanosis or edema Pulses: 2+ and symmetric trace ankle swelling.  Neurologic: Grossly normal    Assessment & Plan:

## 2011-12-04 NOTE — Assessment & Plan Note (Signed)
Patient is followed by urologist seems to be a symptomatically at this time we'll continue the Flomax make no changes.

## 2011-12-04 NOTE — Assessment & Plan Note (Addendum)
Patient is minorly elevated today but seems to be doing well. We'll make no changes patient's has not been high much and does not have any symptoms. Will recheck again at followup visit. Complete metabolic panel ordered today.

## 2011-12-08 ENCOUNTER — Other Ambulatory Visit: Payer: Self-pay | Admitting: Family Medicine

## 2011-12-09 NOTE — Telephone Encounter (Signed)
Refill request

## 2011-12-16 ENCOUNTER — Ambulatory Visit (INDEPENDENT_AMBULATORY_CARE_PROVIDER_SITE_OTHER): Payer: Self-pay | Admitting: Surgery

## 2011-12-16 ENCOUNTER — Encounter (INDEPENDENT_AMBULATORY_CARE_PROVIDER_SITE_OTHER): Payer: Self-pay | Admitting: Surgery

## 2011-12-16 DIAGNOSIS — K4090 Unilateral inguinal hernia, without obstruction or gangrene, not specified as recurrent: Secondary | ICD-10-CM

## 2011-12-16 NOTE — Progress Notes (Signed)
Chief Complaint  Patient presents with  . Other    new pt- eval hernia   HISTORY: Patient is a 66 year old black male who presents with a long-standing left inguinal hernia. This is been present for over 20 years. It has gradually increased in size. It causes discomfort with physical activity. Patient denies any signs or symptoms of intestinal obstruction. He has had no prior abdominal surgery and no prior hernia repair. He presents today at the request of his primary care physician for evaluation for hernia repair.  Past Medical History  Diagnosis Date  . BPH (benign prostatic hypertrophy) with urinary obstruction     followed by Dr. Annabell Howells at [facility]  . GERD (gastroesophageal reflux disease)   . Hypertension      Current Outpatient Prescriptions  Medication Sig Dispense Refill  . aspirin 81 MG tablet Take 81 mg by mouth daily.        Marland Kitchen atenolol-chlorthalidone (TENORETIC) 50-25 MG per tablet Take 1 tablet by mouth daily.  90 tablet  3  . Tamsulosin HCl (FLOMAX) 0.4 MG CAPS Take 0.4 mg by mouth daily.        Marland Kitchen DISCONTD: atenolol-chlorthalidone (TENORETIC) 50-25 MG per tablet TAKE 1 TABLET BY MOUTH DAILY FOR BLOOD PRESSURE CONTROL  30 tablet  3     Allergies  Allergen Reactions  . Ace Inhibitors     REACTION: cough  . Diltiazem Hcl     REACTION: Exacerbated reflux.     Family History  Problem Relation Age of Onset  . Alzheimer's disease Mother   . Hypertension Father   . Hypertension Brother   . Cancer Maternal Uncle     colon     History   Social History  . Marital Status: Married    Spouse Name: Gwendolyn    Number of Children: N/A  . Years of Education: N/A   Social History Main Topics  . Smoking status: Former Smoker    Quit date: 12/07/1973  . Smokeless tobacco: None  . Alcohol Use: No  . Drug Use: No  . Sexually Active:    Other Topics Concern  . None   Social History Narrative   Married for 22 years.  Has one stepson.  Owns car, but wife  does most of the driving.  Walks for exercise - walks for 4 hours around the parking lot while his wife gets dialysis three times a week.     REVIEW OF SYSTEMS - PERTINENT POSITIVES ONLY: Intermittent discomfort with physical activity. No signs or symptoms of obstruction.   EXAM: Filed Vitals:   12/16/11 1129  BP: 132/84  Pulse: 72  Temp: 98.6 F (37 C)  Resp: 16    HEENT: normocephalic; pupils equal and reactive; sclerae clear; dentition good; mucous membranes moist NECK:  No nodules; symmetric on extension; no palpable anterior or posterior cervical lymphadenopathy; no supraclavicular masses; no tenderness CHEST: clear to auscultation bilaterally without rales, rhonchi, or wheezes CARDIAC: regular rate and rhythm without significant murmur; peripheral pulses are full GU:  Normal uncircumcized male; large LIH, reducible, mildly tender;  Some laxity in right canal, no hernia EXT:  non-tender without edema; no deformity NEURO: no gross focal deficits; no sign of tremor   LABORATORY RESULTS: See E-Chart for most recent results   RADIOLOGY RESULTS: See E-Chart or I-Site for most recent results   IMPRESSION: Left inguinal hernia, reducible, symptomatic  PLAN: I discussed with the patient the indications for repair. We discussed the use of prosthetic mesh.  We discussed restrictions on his activities following the procedure. He understands and wishes to proceed in the near future. We will make arrangements for outpatient surgery at a time convenient for the patient.  The risks and benefits of the procedure have been discussed at length with the patient.  The patient understands the proposed procedure, potential alternative treatments, and the course of recovery to be expected.  All of the patient's questions have been answered at this time.  The patient wishes to proceed with surgery and will schedule a date for their procedure through our office staff.  Velora Heckler, MD,  FACS General & Endocrine Surgery [facility]   Visit Diagnoses: 1. Inguinal hernia     Primary Care Physician: Antoine Primas, DO, DO

## 2011-12-16 NOTE — Patient Instructions (Signed)

## 2012-01-08 ENCOUNTER — Encounter (HOSPITAL_BASED_OUTPATIENT_CLINIC_OR_DEPARTMENT_OTHER)
Admission: RE | Admit: 2012-01-08 | Discharge: 2012-01-08 | Disposition: A | Discharge status: Home / Self Care | Payer: Self-pay | Source: Ambulatory Visit | Attending: Surgery | Admitting: Surgery

## 2012-01-08 ENCOUNTER — Encounter (HOSPITAL_BASED_OUTPATIENT_CLINIC_OR_DEPARTMENT_OTHER): Payer: Self-pay | Admitting: *Deleted

## 2012-01-08 NOTE — Progress Notes (Signed)
To come in for ekg,bmet  

## 2012-01-11 ENCOUNTER — Encounter (HOSPITAL_BASED_OUTPATIENT_CLINIC_OR_DEPARTMENT_OTHER)
Admission: RE | Admit: 2012-01-11 | Discharge: 2012-01-11 | Disposition: A | Discharge status: Home / Self Care | Payer: Self-pay | Source: Ambulatory Visit | Attending: Surgery | Admitting: Surgery

## 2012-01-11 LAB — BASIC METABOLIC PANEL
BUN: 6 mg/dL (ref 6–23)
CO2: 33 mEq/L — ABNORMAL HIGH (ref 19–32)
Calcium: 9.6 mg/dL (ref 8.4–10.5)
Creatinine, Ser: 0.85 mg/dL (ref 0.50–1.35)
Glucose, Bld: 95 mg/dL (ref 70–99)

## 2012-01-15 ENCOUNTER — Encounter (HOSPITAL_BASED_OUTPATIENT_CLINIC_OR_DEPARTMENT_OTHER)
Admission: RE | Disposition: A | Discharge status: Home / Self Care | Payer: Self-pay | Source: Ambulatory Visit | Attending: Surgery

## 2012-01-15 ENCOUNTER — Encounter (HOSPITAL_BASED_OUTPATIENT_CLINIC_OR_DEPARTMENT_OTHER): Payer: Self-pay | Admitting: Anesthesiology

## 2012-01-15 ENCOUNTER — Ambulatory Visit (HOSPITAL_BASED_OUTPATIENT_CLINIC_OR_DEPARTMENT_OTHER)
Admission: RE | Admit: 2012-01-15 | Discharge: 2012-01-15 | Disposition: A | Discharge status: Home / Self Care | Payer: Self-pay | Source: Ambulatory Visit | Attending: Surgery | Admitting: Surgery

## 2012-01-15 ENCOUNTER — Encounter (HOSPITAL_BASED_OUTPATIENT_CLINIC_OR_DEPARTMENT_OTHER): Payer: Self-pay | Admitting: *Deleted

## 2012-01-15 DIAGNOSIS — K4090 Unilateral inguinal hernia, without obstruction or gangrene, not specified as recurrent: Secondary | ICD-10-CM | POA: Insufficient documentation

## 2012-01-15 DIAGNOSIS — K219 Gastro-esophageal reflux disease without esophagitis: Secondary | ICD-10-CM | POA: Insufficient documentation

## 2012-01-15 DIAGNOSIS — N320 Bladder-neck obstruction: Secondary | ICD-10-CM | POA: Insufficient documentation

## 2012-01-15 DIAGNOSIS — N401 Enlarged prostate with lower urinary tract symptoms: Secondary | ICD-10-CM | POA: Insufficient documentation

## 2012-01-15 DIAGNOSIS — N138 Other obstructive and reflux uropathy: Secondary | ICD-10-CM | POA: Insufficient documentation

## 2012-01-15 DIAGNOSIS — I10 Essential (primary) hypertension: Secondary | ICD-10-CM | POA: Insufficient documentation

## 2012-01-15 HISTORY — DX: Unspecified osteoarthritis, unspecified site: M19.90

## 2012-01-15 HISTORY — PX: INGUINAL HERNIA REPAIR: SHX194

## 2012-01-15 LAB — POCT HEMOGLOBIN-HEMACUE: Hemoglobin: 17.3 g/dL — ABNORMAL HIGH (ref 13.0–17.0)

## 2012-01-15 SURGERY — REPAIR, HERNIA, INGUINAL, ADULT
Anesthesia: General | Site: Abdomen | Laterality: Left | Wound class: Clean

## 2012-01-15 MED ORDER — FENTANYL CITRATE 0.05 MG/ML IJ SOLN
INTRAMUSCULAR | Status: DC | PRN
  Administered 2012-01-15 (×2): 25 ug via INTRAVENOUS
  Administered 2012-01-15: 50 ug via INTRAVENOUS

## 2012-01-15 MED ORDER — FENTANYL CITRATE 0.05 MG/ML IJ SOLN: 25.0000 ug | INTRAMUSCULAR | Status: DC | PRN

## 2012-01-15 MED ORDER — EPHEDRINE SULFATE 50 MG/ML IJ SOLN
INTRAMUSCULAR | Status: DC | PRN
  Administered 2012-01-15: 10 mg via INTRAVENOUS

## 2012-01-15 MED ORDER — BUPIVACAINE HCL (PF) 0.5 % IJ SOLN
INTRAMUSCULAR | Status: DC | PRN
  Administered 2012-01-15: 25 mL

## 2012-01-15 MED ORDER — DEXAMETHASONE SODIUM PHOSPHATE 4 MG/ML IJ SOLN
INTRAMUSCULAR | Status: DC | PRN
  Administered 2012-01-15: 10 mg via INTRAVENOUS

## 2012-01-15 MED ORDER — ONDANSETRON HCL 4 MG/2ML IJ SOLN
INTRAMUSCULAR | Status: DC | PRN
  Administered 2012-01-15: 4 mg via INTRAVENOUS

## 2012-01-15 MED ORDER — METOCLOPRAMIDE HCL 5 MG/ML IJ SOLN: 10.0000 mg | Freq: Once | INTRAMUSCULAR | Status: DC | PRN

## 2012-01-15 MED ORDER — LACTATED RINGERS IV SOLN
INTRAVENOUS | Status: DC
  Administered 2012-01-15 (×2): via INTRAVENOUS

## 2012-01-15 MED ORDER — FENTANYL CITRATE 0.05 MG/ML IJ SOLN
50.0000 ug | INTRAMUSCULAR | Status: DC | PRN
  Administered 2012-01-15: 100 ug via INTRAVENOUS

## 2012-01-15 MED ORDER — ACETAMINOPHEN 10 MG/ML IV SOLN
1000.0000 mg | Freq: Once | INTRAVENOUS | Status: AC
  Administered 2012-01-15: 1000 mg via INTRAVENOUS

## 2012-01-15 MED ORDER — CEFAZOLIN SODIUM 1-5 GM-% IV SOLN
INTRAVENOUS | Status: DC | PRN
  Administered 2012-01-15: 1 g via INTRAVENOUS

## 2012-01-15 MED ORDER — MORPHINE SULFATE 2 MG/ML IJ SOLN: 0.0500 mg/kg | INTRAMUSCULAR | Status: DC | PRN

## 2012-01-15 MED ORDER — CEFAZOLIN SODIUM 1-5 GM-% IV SOLN: 1.0000 g | INTRAVENOUS | Status: DC

## 2012-01-15 MED ORDER — PROPOFOL 10 MG/ML IV EMUL
INTRAVENOUS | Status: DC | PRN
  Administered 2012-01-15: 200 mg via INTRAVENOUS

## 2012-01-15 MED ORDER — HYDROCODONE-ACETAMINOPHEN 10-325 MG PO TABS: 1.0000 | ORAL_TABLET | ORAL | Status: AC | PRN

## 2012-01-15 MED ORDER — BUPIVACAINE HCL (PF) 0.25 % IJ SOLN
INTRAMUSCULAR | Status: DC | PRN
  Administered 2012-01-15: 20 mL

## 2012-01-15 MED ORDER — MIDAZOLAM HCL 2 MG/2ML IJ SOLN
0.5000 mg | INTRAMUSCULAR | Status: DC | PRN
  Administered 2012-01-15: 2 mg via INTRAVENOUS

## 2012-01-15 SURGICAL SUPPLY — 43 items
BENZOIN TINCTURE PRP APPL 2/3 (GAUZE/BANDAGES/DRESSINGS) ×2 IMPLANT
BLADE SURG 15 STRL LF DISP TIS (BLADE) ×1 IMPLANT
BLADE SURG 15 STRL SS (BLADE) ×1
BLADE SURG ROTATE 9660 (MISCELLANEOUS) ×2 IMPLANT
CANISTER SUCTION 1200CC (MISCELLANEOUS) IMPLANT
CHLORAPREP W/TINT 26ML (MISCELLANEOUS) ×2 IMPLANT
CLEANER CAUTERY TIP 5X5 PAD (MISCELLANEOUS) ×1 IMPLANT
CLOTH BEACON ORANGE TIMEOUT ST (SAFETY) ×2 IMPLANT
COVER MAYO STAND STRL (DRAPES) ×2 IMPLANT
COVER TABLE BACK 60X90 (DRAPES) ×2 IMPLANT
DECANTER SPIKE VIAL GLASS SM (MISCELLANEOUS) IMPLANT
DRAIN PENROSE 1/2X12 LTX STRL (WOUND CARE) ×2 IMPLANT
DRAPE PED LAPAROTOMY (DRAPES) ×2 IMPLANT
DRAPE UTILITY XL STRL (DRAPES) ×2 IMPLANT
ELECT REM PT RETURN 9FT ADLT (ELECTROSURGICAL) ×2
ELECTRODE REM PT RTRN 9FT ADLT (ELECTROSURGICAL) ×1 IMPLANT
GAUZE SPONGE 4X4 12PLY STRL LF (GAUZE/BANDAGES/DRESSINGS) ×2 IMPLANT
GLOVE BIO SURGEON STRL SZ 6.5 (GLOVE) ×2 IMPLANT
GLOVE BIOGEL PI IND STRL 7.0 (GLOVE) ×1 IMPLANT
GLOVE BIOGEL PI INDICATOR 7.0 (GLOVE) ×1
GLOVE SURG ORTHO 8.0 STRL STRW (GLOVE) ×2 IMPLANT
GOWN PREVENTION PLUS XLARGE (GOWN DISPOSABLE) ×2 IMPLANT
GOWN PREVENTION PLUS XXLARGE (GOWN DISPOSABLE) ×2 IMPLANT
MESH ULTRAPRO 3X6 7.6X15CM (Mesh General) ×2 IMPLANT
NEEDLE HYPO 25X1 1.5 SAFETY (NEEDLE) ×2 IMPLANT
NS IRRIG 1000ML POUR BTL (IV SOLUTION) IMPLANT
PACK BASIN DAY SURGERY FS (CUSTOM PROCEDURE TRAY) ×2 IMPLANT
PAD CLEANER CAUTERY TIP 5X5 (MISCELLANEOUS) ×1
PENCIL BUTTON HOLSTER BLD 10FT (ELECTRODE) ×2 IMPLANT
SLEEVE SCD COMPRESS KNEE MED (MISCELLANEOUS) ×2 IMPLANT
STRIP CLOSURE SKIN 1/2X4 (GAUZE/BANDAGES/DRESSINGS) ×2 IMPLANT
SUT MNCRL AB 4-0 PS2 18 (SUTURE) ×2 IMPLANT
SUT NOVA NAB GS-22 2 0 T19 (SUTURE) ×4 IMPLANT
SUT SILK 2 0 SH (SUTURE) ×2 IMPLANT
SUT SILK 2 0 TIES 17X18 (SUTURE)
SUT SILK 2-0 18XBRD TIE BLK (SUTURE) IMPLANT
SUT VICRYL 3-0 CR8 SH (SUTURE) ×2 IMPLANT
SYR CONTROL 10ML LL (SYRINGE) ×2 IMPLANT
TOWEL OR 17X24 6PK STRL BLUE (TOWEL DISPOSABLE) ×2 IMPLANT
TOWEL OR NON WOVEN STRL DISP B (DISPOSABLE) ×2 IMPLANT
TUBE CONNECTING 20X1/4 (TUBING) IMPLANT
WATER STERILE IRR 1000ML POUR (IV SOLUTION) IMPLANT
YANKAUER SUCT BULB TIP NO VENT (SUCTIONS) IMPLANT

## 2012-01-15 NOTE — Anesthesia Preprocedure Evaluation (Signed)
Anesthesia Evaluation  Patient identified by MRN, date of birth, ID band Patient awake    Reviewed: Allergy & Precautions, H&P , NPO status , Patient's Chart, lab work & pertinent test results, reviewed documented beta blocker date and time   Airway Mallampati: II TM Distance: >3 FB Neck ROM: full    Dental   Pulmonary neg pulmonary ROS,          Cardiovascular hypertension, On Medications and On Home Beta Blockers     Neuro/Psych Negative Neurological ROS  Negative Psych ROS   GI/Hepatic Neg liver ROS, GERD-  Medicated and Controlled,  Endo/Other  Negative Endocrine ROS  Renal/GU negative Renal ROS  Genitourinary negative   Musculoskeletal   Abdominal   Peds  Hematology negative hematology ROS (+)   Anesthesia Other Findings See surgeon's H&P   Reproductive/Obstetrics negative OB ROS                           Anesthesia Physical Anesthesia Plan  ASA: II  Anesthesia Plan: General   Post-op Pain Management: MAC Combined w/ Regional for Post-op pain   Induction: Intravenous  Airway Management Planned: LMA  Additional Equipment:   Intra-op Plan:   Post-operative Plan: Extubation in OR  Informed Consent: I have reviewed the patients History and Physical, chart, labs and discussed the procedure including the risks, benefits and alternatives for the proposed anesthesia with the patient or authorized representative who has indicated his/her understanding and acceptance.     Plan Discussed with: CRNA and Surgeon  Anesthesia Plan Comments:         Anesthesia Quick Evaluation

## 2012-01-15 NOTE — Transfer of Care (Signed)
Immediate Anesthesia Transfer of Care Note  Patient: Peter Lam  Procedure(s) Performed:  HERNIA REPAIR INGUINAL ADULT - Left inguinal hernia with mesh   Patient Location: PACU  Anesthesia Type: GA combined with regional for post-op pain  Level of Consciousness: sedated  Airway & Oxygen Therapy: Patient Spontanous Breathing and Patient connected to face mask oxygen  Post-op Assessment: Report given to PACU RN and Post -op Vital signs reviewed and stable  Post vital signs: Reviewed and stable  Complications: No apparent anesthesia complications

## 2012-01-15 NOTE — H&P (View-Only) (Signed)
Chief Complaint  Patient presents with  . Other    new pt- eval hernia   HISTORY: Patient is a 65-year-old black male who presents with a long-standing left inguinal hernia. This is been present for over 20 years. It has gradually increased in size. It causes discomfort with physical activity. Patient denies any signs or symptoms of intestinal obstruction. He has had no prior abdominal surgery and no prior hernia repair. He presents today at the request of his primary care physician for evaluation for hernia repair.  Past Medical History  Diagnosis Date  . BPH (benign prostatic hypertrophy) with urinary obstruction     followed by Dr. Wrenn at [facility]  . GERD (gastroesophageal reflux disease)   . Hypertension      Current Outpatient Prescriptions  Medication Sig Dispense Refill  . aspirin 81 MG tablet Take 81 mg by mouth daily.        . atenolol-chlorthalidone (TENORETIC) 50-25 MG per tablet Take 1 tablet by mouth daily.  90 tablet  3  . Tamsulosin HCl (FLOMAX) 0.4 MG CAPS Take 0.4 mg by mouth daily.        . DISCONTD: atenolol-chlorthalidone (TENORETIC) 50-25 MG per tablet TAKE 1 TABLET BY MOUTH DAILY FOR BLOOD PRESSURE CONTROL  30 tablet  3     Allergies  Allergen Reactions  . Ace Inhibitors     REACTION: cough  . Diltiazem Hcl     REACTION: Exacerbated reflux.     Family History  Problem Relation Age of Onset  . Alzheimer's disease Mother   . Hypertension Father   . Hypertension Brother   . Cancer Maternal Uncle     colon     History   Social History  . Marital Status: Married    Spouse Name: Gwendolyn    Number of Children: N/A  . Years of Education: N/A   Social History Main Topics  . Smoking status: Former Smoker    Quit date: 12/07/1973  . Smokeless tobacco: None  . Alcohol Use: No  . Drug Use: No  . Sexually Active:    Other Topics Concern  . None   Social History Narrative   Married for 22 years.  Has one stepson.  Owns car, but wife  does most of the driving.  Walks for exercise - walks for 4 hours around the parking lot while his wife gets dialysis three times a week.     REVIEW OF SYSTEMS - PERTINENT POSITIVES ONLY: Intermittent discomfort with physical activity. No signs or symptoms of obstruction.   EXAM: Filed Vitals:   12/16/11 1129  BP: 132/84  Pulse: 72  Temp: 98.6 F (37 C)  Resp: 16    HEENT: normocephalic; pupils equal and reactive; sclerae clear; dentition good; mucous membranes moist NECK:  No nodules; symmetric on extension; no palpable anterior or posterior cervical lymphadenopathy; no supraclavicular masses; no tenderness CHEST: clear to auscultation bilaterally without rales, rhonchi, or wheezes CARDIAC: regular rate and rhythm without significant murmur; peripheral pulses are full GU:  Normal uncircumcized male; large LIH, reducible, mildly tender;  Some laxity in right canal, no hernia EXT:  non-tender without edema; no deformity NEURO: no gross focal deficits; no sign of tremor   LABORATORY RESULTS: See E-Chart for most recent results   RADIOLOGY RESULTS: See E-Chart or I-Site for most recent results   IMPRESSION: Left inguinal hernia, reducible, symptomatic  PLAN: I discussed with the patient the indications for repair. We discussed the use of prosthetic mesh.   We discussed restrictions on his activities following the procedure. He understands and wishes to proceed in the near future. We will make arrangements for outpatient surgery at a time convenient for the patient.  The risks and benefits of the procedure have been discussed at length with the patient.  The patient understands the proposed procedure, potential alternative treatments, and the course of recovery to be expected.  All of the patient's questions have been answered at this time.  The patient wishes to proceed with surgery and will schedule a date for their procedure through our office staff.  Aqueelah Cotrell M. Keshauna Degraffenreid, MD,  FACS General & Endocrine Surgery [facility]   Visit Diagnoses: 1. Inguinal hernia     Primary Care Physician: SMITH,ZACHARY, DO, DO   

## 2012-01-15 NOTE — Progress Notes (Signed)
Assisted Dr. Frederick with left, ultrasound guided, transabdominal plane block. Side rails up, monitors on throughout procedure. See vital signs in flow sheet. Tolerated Procedure well. 

## 2012-01-15 NOTE — Anesthesia Postprocedure Evaluation (Signed)
Anesthesia Post Note  Patient: Peter Lam  Procedure(s) Performed:  HERNIA REPAIR INGUINAL ADULT - Left inguinal hernia with mesh   Anesthesia type: General  Patient location: PACU  Post pain: Pain level controlled  Post assessment: Patient's Cardiovascular Status Stable  Last Vitals:  Filed Vitals:   01/15/12 1422  BP: 134/89  Pulse: 89  Temp: 36.4 C  Resp: 16    Post vital signs: Reviewed and stable  Level of consciousness: alert  Complications: No apparent anesthesia complications

## 2012-01-15 NOTE — Interval H&P Note (Signed)
History and Physical Interval Note:  01/15/2012 11:39 AM  Peter Lam  has presented today for surgery, with the diagnosis of left inguinal hernia.   The various methods of treatment have been discussed with the patient and family. After consideration of risks, benefits and other options for treatment, the patient has consented to    Procedure(s): HERNIA REPAIR INGUINAL ADULT as a surgical intervention .    The patients' history has been reviewed, patient examined, no change in status, stable for surgery.  I have reviewed the patients' chart and labs.  Questions were answered to the patient's satisfaction.    Velora Heckler, MD, FACS General & Endocrine Surgery [facility]   Aloma Boch Judie Petit

## 2012-01-15 NOTE — Anesthesia Procedure Notes (Addendum)
Anesthesia Regional Block:  TAP block  Pre-Anesthetic Checklist: ,, timeout performed, Correct Patient, Correct Site, Correct Laterality, Correct Procedure, Correct Position, site marked, Risks and benefits discussed,  Surgical consent,  Pre-op evaluation,  At surgeon's request and post-op pain management  Laterality: Left  Prep: chloraprep       Needles:  Injection technique: Single-shot  Needle Type: Echogenic Needle     Needle Length: 9cm  Needle Gauge: 21    Additional Needles:  Procedures: ultrasound guided TAP block Narrative:  Start time: 01/15/2012 11:25 AM End time: 01/15/2012 11:34 AM Injection made incrementally with aspirations every 5 mL.  Performed by: Personally  Anesthesiologist: Aldona Lento, MD  Additional Notes: Ultrasound guidance used to: id relevant anatomy, confirm needle position, local anesthetic spread, avoidance of vascular puncture. Picture saved. No complications. Block performed personally by Janetta Hora. Gelene Mink, MD    TAP block Procedure Name: LMA Insertion Date/Time: 01/15/2012 11:59 AM Performed by: Signa Kell Pre-anesthesia Checklist: Patient identified, Emergency Drugs available, Suction available and Patient being monitored Patient Re-evaluated:Patient Re-evaluated prior to inductionOxygen Delivery Method: Circle System Utilized Preoxygenation: Pre-oxygenation with 100% oxygen Intubation Type: IV induction Ventilation: Mask ventilation without difficulty LMA: LMA inserted LMA Size: 5.0 Number of attempts: 1 Airway Equipment and Method: bite block Placement Confirmation: positive ETCO2 Tube secured with: Tape Dental Injury: Teeth and Oropharynx as per pre-operative assessment

## 2012-01-15 NOTE — Op Note (Signed)
Inguinal Hernia, Open, Procedure Note  Pre-operative Diagnosis:  Left inguinal hernia, reducible  Post-operative Diagnosis: same  Procedure:  Repair left inguinal hernia with Ethicon Ultrapro mesh  Surgeon:  Velora Heckler, MD, FACS  Anesthesia:  General  Indications: The patient presented with a left, reducible hernia.    Procedure Details  The patient was seen again in the Holding Room. The risks, benefits, complications, treatment options, and expected outcomes were discussed with the patient.  There was concurrence with the proposed plan, and informed consent was obtained. The site of surgery was properly noted/marked. The patient was taken to the Operating Room, identified by name, and the procedure verified as hernia repair. A Time Out was held and the above information confirmed.  The patient was placed in the supine position and underwent induction of anesthesia.  The lower abdomen and groin was prepped and draped in the usual strict aseptic fashion.  After ascertaining that an adequate level of anesthesia had been obtained, and incision is made in the groin with a #10 blade.  Dissection is carried through the subcutaneous tissues and hemostasis obtained with the electrocautery.  A Gelpi retractor is placed for exposure.  The external oblique fascia is incised in line with it's fibers and extended through the external inguinal ring.  The cord structures are dissected out of the inguinal canal and encircled with a Penrose drain.  The floor of the inguinal canal is dissected out.  The cord is explored and no evidence of indirect inguinal hernia was found.  The floor of the canal demonstrated a large direct inguinal hernia.  This was dissected out and held in reduction with interrupted 2-0 silk sutures.  The floor of the inguinal canal is reconstructed with a sheet of mesh cut to the appropriate dimensions.  It is secured to the pubic tubercle with a 2-0 Novafil suture and along the inguinal  ligament with a running 2-0 Novafil suture.  Mesh is split to accommodate the cord structures.  The superior edge of the mesh is secured to the transversalis and internal oblique muscles with interrupted 2-0 Novafil sutures.  The tails of the mesh are overlapped lateral to the cord structures and secured to the inguinal ligament with interrupted 2-0 Novafil sutures to recreate the internal inguinal ring.  Cord structures are returned to the inguinal canal.  Local anesthetic is infiltrated throughout the field.  External oblique fascia is closed with interrupted 3-0 Vicryl sutures.  Subcutaneous tissues are closed with interrupted 3-0 Vicryl sutures.  Skin is anesthetized with local anesthetic, and the skin edges re-approximated with a running 4-0 Monocryl suture.  Wound is washed and dried and benzoin and steristrips are applied.  A gauze dressing is then applied.  Instrument, sponge, and needle counts were correct prior to closure and at the conclusion of the case.  Velora Heckler, MD, FACS General & Endocrine Surgery [facility]   Findings: Hernia as above  Estimated Blood Loss: Minimal         Specimens: None  Complications: None; patient tolerated the procedure well.         Disposition: PACU - hemodynamically stable.         Condition: stable

## 2012-01-18 ENCOUNTER — Encounter (HOSPITAL_BASED_OUTPATIENT_CLINIC_OR_DEPARTMENT_OTHER): Payer: Self-pay | Admitting: Surgery

## 2012-01-28 ENCOUNTER — Telehealth (INDEPENDENT_AMBULATORY_CARE_PROVIDER_SITE_OTHER): Payer: Self-pay

## 2012-01-28 NOTE — Telephone Encounter (Signed)
C/o decreased stream when voiding this morning- pain- urine dark brown- Patient to see Dr. Annabell Howells today.  Left inguinal hernia on 01/15/2012.  Patient wife expired yesterday with cancer.

## 2012-01-28 NOTE — Telephone Encounter (Signed)
done

## 2012-02-08 ENCOUNTER — Ambulatory Visit (INDEPENDENT_AMBULATORY_CARE_PROVIDER_SITE_OTHER): Payer: Self-pay | Admitting: Surgery

## 2012-02-08 ENCOUNTER — Encounter (INDEPENDENT_AMBULATORY_CARE_PROVIDER_SITE_OTHER): Payer: Self-pay | Admitting: Surgery

## 2012-02-08 DIAGNOSIS — K4090 Unilateral inguinal hernia, without obstruction or gangrene, not specified as recurrent: Secondary | ICD-10-CM

## 2012-02-08 NOTE — Progress Notes (Signed)
Visit Diagnoses: 1. Inguinal hernia     HISTORY: Patient returns for his first postoperative visit having undergone left inguinal hernia repair with mesh 4 weeks ago. Patient reports having no significant pain after his surgery. He did not taking any narcotic pain medication.   EXAM: The wound is well-healed. Steri-Strips are removed in the office. Minimal soft tissue swelling. No sign of recurrence with cough and Valsalva. No significant tenderness.  IMPRESSION: Status post left inguinal hernia repair with mesh without complication  PLAN: Patient will begin applying topical creams to his incision. He is distracted to 25 pounds lifting for the next 3 weeks. He will return to see me as needed.  Velora Heckler, MD, FACS General & Endocrine Surgery [facility]

## 2012-02-08 NOTE — Patient Instructions (Signed)
  COCOA BUTTER & VITAMIN E CREAM  ([employer] or other brand)  Apply cocoa butter/vitamin E cream to your incision 2 - 3 times daily.  Massage cream into incision for one minute with each application.  Use sunscreen (50 SPF or higher) for first 6 months after surgery if area is exposed to sun.  You may substitute Mederma or other scar reducing creams as desired.   

## 2012-03-14 ENCOUNTER — Ambulatory Visit (INDEPENDENT_AMBULATORY_CARE_PROVIDER_SITE_OTHER): Payer: Self-pay | Admitting: *Deleted

## 2012-03-14 VITALS — BP 120/84 | HR 94

## 2012-03-14 DIAGNOSIS — I10 Essential (primary) hypertension: Secondary | ICD-10-CM

## 2012-03-14 NOTE — Progress Notes (Signed)
Patient came in for BP check.  BP checked manually using regular adult cuff . LA 120 84 pulse 94. He will schedule appointment to follow up with MD.

## 2012-04-08 ENCOUNTER — Other Ambulatory Visit: Payer: Self-pay | Admitting: Family Medicine

## 2012-04-29 ENCOUNTER — Ambulatory Visit: Payer: Self-pay | Admitting: Family Medicine

## 2012-05-08 ENCOUNTER — Encounter (HOSPITAL_COMMUNITY): Payer: Self-pay | Admitting: Emergency Medicine

## 2012-05-08 ENCOUNTER — Emergency Department (HOSPITAL_COMMUNITY)
Admission: EM | Admit: 2012-05-08 | Discharge: 2012-05-08 | Disposition: A | Discharge status: Home / Self Care | Payer: Self-pay | Attending: Emergency Medicine | Admitting: Emergency Medicine

## 2012-05-08 DIAGNOSIS — Z87891 Personal history of nicotine dependence: Secondary | ICD-10-CM | POA: Insufficient documentation

## 2012-05-08 DIAGNOSIS — N400 Enlarged prostate without lower urinary tract symptoms: Secondary | ICD-10-CM | POA: Insufficient documentation

## 2012-05-08 DIAGNOSIS — E876 Hypokalemia: Secondary | ICD-10-CM | POA: Insufficient documentation

## 2012-05-08 DIAGNOSIS — Z79899 Other long term (current) drug therapy: Secondary | ICD-10-CM | POA: Insufficient documentation

## 2012-05-08 DIAGNOSIS — I10 Essential (primary) hypertension: Secondary | ICD-10-CM | POA: Insufficient documentation

## 2012-05-08 DIAGNOSIS — M129 Arthropathy, unspecified: Secondary | ICD-10-CM | POA: Insufficient documentation

## 2012-05-08 DIAGNOSIS — K219 Gastro-esophageal reflux disease without esophagitis: Secondary | ICD-10-CM | POA: Insufficient documentation

## 2012-05-08 LAB — URINALYSIS, ROUTINE W REFLEX MICROSCOPIC
Bilirubin Urine: NEGATIVE
Glucose, UA: NEGATIVE mg/dL
Hgb urine dipstick: NEGATIVE
Ketones, ur: NEGATIVE mg/dL
Leukocytes, UA: NEGATIVE
Nitrite: NEGATIVE
Protein, ur: NEGATIVE mg/dL
Specific Gravity, Urine: 1.023 (ref 1.005–1.030)
Urobilinogen, UA: 0.2 mg/dL (ref 0.0–1.0)
pH: 6 (ref 5.0–8.0)

## 2012-05-08 LAB — POCT I-STAT, CHEM 8
BUN: 7 mg/dL (ref 6–23)
Calcium, Ion: 1.14 mmol/L (ref 1.12–1.32)
Chloride: 99 mEq/L (ref 96–112)
Creatinine, Ser: 1 mg/dL (ref 0.50–1.35)
Glucose, Bld: 101 mg/dL — ABNORMAL HIGH (ref 70–99)
HCT: 49 % (ref 39.0–52.0)
Hemoglobin: 16.7 g/dL (ref 13.0–17.0)
Potassium: 2.6 mEq/L — CL (ref 3.5–5.1)
Sodium: 140 mEq/L (ref 135–145)
TCO2: 29 mmol/L (ref 0–100)

## 2012-05-08 MED ORDER — POTASSIUM CHLORIDE ER 10 MEQ PO TBCR: 10.0000 meq | EXTENDED_RELEASE_TABLET | Freq: Every day | ORAL | Status: DC

## 2012-05-08 MED ORDER — POTASSIUM CHLORIDE CRYS ER 20 MEQ PO TBCR
60.0000 meq | EXTENDED_RELEASE_TABLET | Freq: Once | ORAL | Status: AC
  Administered 2012-05-08: 60 meq via ORAL
  Filled 2012-05-08: qty 3

## 2012-05-08 NOTE — Discharge Instructions (Signed)
Hypokalemia  Hypokalemia means a low potassium level in the blood. Symptoms may include muscle weakness and cramping, fatigue, abdominal pain, vomiting, constipation, or irregularities of the heartbeat. Sometimes hypokalemia is discovered by your caregiver if you are taking certain medicines for high blood pressure or kidney disease.   Potassium is an electrolyte that helps regulate the amount of fluid in the body. It also stimulates muscle contraction and maintains a stable acid-base balance. If potassium levels go too low or too high, your health may be in danger. You are at risk for developing shock, heart, and lung problems. Hypokalemia can occur if you have excessive diarrhea, vomiting, or sweating. Potassium can be lost through your kidneys in the urine. Certain common medicines can also cause potassium loss, especially water pills (diuretics). The same is possible with cortisone medications or certain types of antibiotics. Low potassium can be dangerous if you are taking certain heart medicines. In diabetes, your potassium may fall after you take insulin, especially if your diabetes had been out of control for a while. In rare cases, potassium may be low because you are not getting enough in your diet.   In adults, a potassium level below 3.5 mEq/L is usually considered low.  Hypokalemia can be treated with potassium supplements taken by mouth and a diet that is high in potassium. Foods with high potassium content are:   Peas, lentils, lima beans, nuts, and dried fruit.   Whole grain and bran cereals and breads.   Fresh fruit and vegetables. Examples include:   Bananas.   Cantaloupe.   Grapefruit.   Oranges.   Tomatoes.   Honeydew melons.   Potatoes.   Peaches.   Orange and tomato juices.   Meats.  See your caregiver as instructed for a follow-up blood test to be sure your potassium is back to normal.  SEEK MEDICAL CARE IF:    You have nausea, vomiting, constipation, or abdominal pain.   You  have palpitations or irregular heartbeats, chest pain or shortness of breath.   You have muscle cramps or weakness or fatigue.   You have lethargy.  SEEK IMMEDIATE MEDICAL CARE IF:    You have paralysis.   You have confusion or other mental status changes.  Document Released: 11/23/2005 Document Revised: 11/12/2011 Document Reviewed: 03/19/2010  [employer] Patient Information 2012 [employer].

## 2012-05-08 NOTE — ED Notes (Signed)
MD at bedside. 

## 2012-05-08 NOTE — ED Notes (Addendum)
Pt presenting to ed with c/o dysuria this morning. Pt denies hematuria at this time. Pt states he has an enlarged prostate and is on flomax. Pt states he was urinating fine yesterday and earlier this morning but when he was getting ready to leave for church he could not urinate at all. Pt states he last urinated normal at 5:00am. Pt denies urinary urgency and frequency at this time. Pt states that his urine was dark yesterday

## 2012-05-08 NOTE — ED Provider Notes (Signed)
History    65yM with decreased urinary outpt. Denies dysuria to me as mentioned in triage note. Says tried to void prior to going to church but could not. Told his daughter about this who encouraged to come to ED. Pt with no other complaints. Reports hx of enlarged prostate. On flomax which reports compliance with. Denies hematuria. No abdominal or back pain. No fever or chills. No unusual swelling. PO intake has been good.  CSN: [id number]  Arrival date & time 05/08/12  1010   First MD Initiated Contact with Patient 05/08/12 1020      Chief Complaint  Patient presents with  . Dysuria    (Consider location/radiation/quality/duration/timing/severity/associated sxs/prior treatment) HPI  Past Medical History  Diagnosis Date  . BPH (benign prostatic hypertrophy) with urinary obstruction     followed by Dr. Annabell Howells at [facility]  . GERD (gastroesophageal reflux disease)   . Hypertension   . Arthritis   . Inguinal hernia     Past Surgical History  Procedure Date  . Tonsillectomy and adenoidectomy   . Prostate biopsy   . Multiple tooth extractions   . Inguinal hernia repair 01/15/2012    Procedure: HERNIA REPAIR INGUINAL ADULT;  Surgeon: Velora Heckler, MD;  Location: [facility];  Service: General;  Laterality: Left;  Left inguinal hernia with mesh     Family History  Problem Relation Age of Onset  . Alzheimer's disease Mother   . Hypertension Father   . Hypertension Brother   . Cancer Maternal Uncle     colon    History  Substance Use Topics  . Smoking status: Former Smoker    Quit date: 12/07/1973  . Smokeless tobacco: Never Used  . Alcohol Use: No      Review of Systems   Review of symptoms negative unless otherwise noted in HPI.   Allergies  Ace inhibitors and Diltiazem hcl  Home Medications   Current Outpatient Rx  Name Route Sig Dispense Refill  . ASPIRIN 81 MG PO TABS Oral Take 81 mg by mouth daily.      . ATENOLOL-CHLORTHALIDONE  50-25 MG PO TABS Oral Take 1 tablet by mouth daily. 90 tablet 3  . ATENOLOL-CHLORTHALIDONE 50-25 MG PO TABS  TAKE 1 TABLET BY MOUTH DAILY FOR BLOOD PRESSURE CONTROL 30 tablet 3  . TAMSULOSIN HCL 0.4 MG PO CAPS Oral Take 0.4 mg by mouth daily.        BP 115/82  Pulse 91  Temp(Src) 98.1 F (36.7 C) (Oral)  Resp 20  SpO2 100%  Physical Exam  Nursing note and vitals reviewed. Constitutional: He appears well-developed and well-nourished. No distress.       Laying in bed. Nad. Pleasant.  HENT:  Head: Normocephalic and atraumatic.  Eyes: Conjunctivae are normal. Right eye exhibits no discharge. Left eye exhibits no discharge.  Neck: Neck supple.  Cardiovascular: Normal rate, regular rhythm and normal heart sounds.  Exam reveals no gallop and no friction rub.   No murmur heard. Pulmonary/Chest: Effort normal and breath sounds normal. No respiratory distress.  Abdominal: Soft. He exhibits no distension. There is no tenderness.  Genitourinary:       No cva tenderness  Musculoskeletal: He exhibits no edema and no tenderness.  Neurological: He is alert.  Skin: Skin is warm and dry.  Psychiatric: He has a normal mood and affect. His behavior is normal. Thought content normal.    ED Course  Procedures (including critical care time)  Labs Reviewed  POCT I-STAT, CHEM 8 - Abnormal; Notable for the following:    Potassium 2.6 (*)    Glucose, Bld 101 (*)    All other components within normal limits  URINALYSIS, ROUTINE W REFLEX MICROSCOPIC   No results found.   1. Hypokalemia       MDM  65yM presenting with what he felt like was decreased urinary out put earlier today but feels like voiding normally since being in ED. No complaints prior to DC. UA unremarkable. Renal function fine. Hypokalemia incidentally noted and repleted. Possibly 2/2 diuretic. Script for supplementation provided and discussed diet. Return precautions discussed. Outpt fu.        Raeford Razor, MD 05/12/12  [phone]

## 2012-05-26 ENCOUNTER — Encounter (HOSPITAL_COMMUNITY): Payer: Self-pay | Admitting: Emergency Medicine

## 2012-05-26 ENCOUNTER — Emergency Department (HOSPITAL_COMMUNITY)
Admission: EM | Admit: 2012-05-26 | Discharge: 2012-05-26 | Disposition: A | Discharge status: Home / Self Care | Payer: Self-pay | Attending: Emergency Medicine | Admitting: Emergency Medicine

## 2012-05-26 DIAGNOSIS — Z87891 Personal history of nicotine dependence: Secondary | ICD-10-CM | POA: Insufficient documentation

## 2012-05-26 DIAGNOSIS — K219 Gastro-esophageal reflux disease without esophagitis: Secondary | ICD-10-CM | POA: Insufficient documentation

## 2012-05-26 DIAGNOSIS — K117 Disturbances of salivary secretion: Secondary | ICD-10-CM | POA: Insufficient documentation

## 2012-05-26 DIAGNOSIS — I10 Essential (primary) hypertension: Secondary | ICD-10-CM | POA: Insufficient documentation

## 2012-05-26 DIAGNOSIS — Z8249 Family history of ischemic heart disease and other diseases of the circulatory system: Secondary | ICD-10-CM | POA: Insufficient documentation

## 2012-05-26 DIAGNOSIS — R682 Dry mouth, unspecified: Secondary | ICD-10-CM

## 2012-05-26 DIAGNOSIS — Z800 Family history of malignant neoplasm of digestive organs: Secondary | ICD-10-CM | POA: Insufficient documentation

## 2012-05-26 DIAGNOSIS — M129 Arthropathy, unspecified: Secondary | ICD-10-CM | POA: Insufficient documentation

## 2012-05-26 DIAGNOSIS — N400 Enlarged prostate without lower urinary tract symptoms: Secondary | ICD-10-CM | POA: Insufficient documentation

## 2012-05-26 NOTE — ED Notes (Signed)
Per EMS: pt here for eval for dry mouth; pt speaks complete sentences and sts using new denture adhesive

## 2012-05-26 NOTE — Discharge Instructions (Signed)
Sore or Dry Mouth Care Followup with family practice medicine. Drink more water.  See your doctor immediately--or return to the ED--with any new or troubling symptoms including fevers, weakness, new chest pain, shortness or breath, numbness, or any other concerning symptom.     A sore or dry mouth may happen for many different reasons. Sometimes, treatment for other health problems may have to stop until your sore or dry mouth gets better.  HOME CARE  Do not smoke or chew tobacco.   Use fake (artificial) saliva when your mouth feels dry.   Use a humidifier in your bedroom at night.   Eat small meals and snacks.   Eat food cold or at room temperature.   Suck on ice-chips or try frozen ice pops or juice bars, ice-cream, and watermelon. Do not have citrus flavors.   Suck on hard, sugarless, sour candy, or chew sugarless gum to help make more saliva.   Eat soft foods such as yogurt, bananas, canned fruit, mashed potatoes, oatmeal, rice, eggs, cottage cheese, macaroni and cheese, jello, and pudding.   Microwave vegetables and fruits to soften them.   Puree cooked food in a blender if needed.   Make dry food moist by using olive oil, gravy, or mild sauces. Dip foods in liquids.   Keep a glass of water or squirt bottle nearby. Take sips often throughout the day.   Limit caffeine.   Avoid:   Pop or fizzy drinks.   Alcohol.   Citrus juices.   Acidic food.   Salty or spicy food.   Foods or drinks that are very hot.   Hard or crunchy food.  Mouth Care  Wash your hands well with soap and water before doing mouth care.   Use fake saliva as told by your doctor.   Use medicine on the sore places.   Brush your teeth at least 2 times a day. Brush after each meal if possible. Rinse your mouth with water after each meal and after drinking a sweet drink.   Brush slowly and gently in small circles. Do not brush side-to-side.   Use regular toothpastes, but stay away from ones  that have sodium laurel sulfate in them.   Gargle with a baking soda mouthwash ( teaspoon baking soda mixed in with 4 cups of water).   Gargle with medicated mouthwash.   Use dental floss or dental tape to clean between your teeth every day.   Use a lanolin-based lip balm to keep your lips from getting dry.   If you wear dentures or bridges:   You may need to leave them out until your doctor tells you to start wearing them again.   Take them out at night if you wear them daily. Soak them in warm water or denture solution. Take your dentures out as much as you can during the day. Take them out when you use mouthwash.   After each meal, brush your gums gently with a soft brush and rinse your mouth with water.   If your dentures rub on your gums and cause a sore spot, have your dentist check and fix your dentures right away.  GET HELP RIGHT AWAY IF:   Your mouth gets more painful or dry.   You have questions.  MAKE SURE YOU:  Understand these instructions.   Will watch your condition.   Will get help right away if you are not doing well or get worse.  Document Released: 09/20/2009 Document Revised: 11/12/2011 Document  Reviewed: 09/20/2009 ExitCare Patient Information 2012 [place], [place].

## 2012-05-26 NOTE — ED Provider Notes (Signed)
History     CSN: [id number]  Arrival date & time 05/26/12  0818   First MD Initiated Contact with Patient 05/26/12 [phone]      Chief Complaint  Patient presents with  . Oral Pain    (Consider location/radiation/quality/duration/timing/severity/associated sxs/prior treatment) HPI  66 year old woman past medical history of BPH, GERD, arthritis presents today with a 14 hour history of dry mouth.  Patient denies any recent medication changes. He did start potassium approximately one week ago. He had his blood work checked approximately 5 days ago at [facility] in his blood work was normal. He has no pain, he drank water for coming to the emergency department, and that made his mouth feel better.  He simply wondering what his mouth was dry this morning. He denies any difficulty drinking, eating, he denies any throat pain, headache, jaw pain with mastication.  He denies dry eyes.  His vital signs on arrival normal. He causes illness mild. Drinking made his mouth feel better.  Past Medical History  Diagnosis Date  . BPH (benign prostatic hypertrophy) with urinary obstruction     followed by Dr. Annabell Howells at [facility]  . GERD (gastroesophageal reflux disease)   . Hypertension   . Arthritis   . Inguinal hernia     Past Surgical History  Procedure Date  . Tonsillectomy and adenoidectomy   . Prostate biopsy   . Multiple tooth extractions   . Inguinal hernia repair 01/15/2012    Procedure: HERNIA REPAIR INGUINAL ADULT;  Surgeon: Velora Heckler, MD;  Location: [facility];  Service: General;  Laterality: Left;  Left inguinal hernia with mesh     Family History  Problem Relation Age of Onset  . Alzheimer's disease Mother   . Hypertension Father   . Hypertension Brother   . Cancer Maternal Uncle     colon    History  Substance Use Topics  . Smoking status: Former Smoker    Quit date: 12/07/1973  . Smokeless tobacco: Never Used  . Alcohol Use: No      Review of  Systems Constitutional: Negative for fever and chills.  HENT: Negative for ear pain, sore throat and trouble swallowing.   Eyes: Negative for pain and visual disturbance. Denies eye dryness Respiratory: Negative for cough and shortness of breath.   Cardiovascular: Negative for chest pain and leg swelling.  Gastrointestinal: Negative for nausea, vomiting, abdominal pain and diarrhea.  Genitourinary: Negative for dysuria, urgency and frequency.  Musculoskeletal: Negative for back pain and joint swelling.  Skin: Negative for rash and wound.  Neurological: Negative for dizziness, syncope, speech difficulty, weakness and numbness.   Allergies  Ace inhibitors and Diltiazem hcl  Home Medications   Current Outpatient Rx  Name Route Sig Dispense Refill  . ASPIRIN EC 81 MG PO TBEC Oral Take 81 mg by mouth daily.    . ATENOLOL-CHLORTHALIDONE 50-25 MG PO TABS  TAKE 1 TABLET BY MOUTH DAILY FOR BLOOD PRESSURE CONTROL 30 tablet 3  . POTASSIUM CHLORIDE ER 10 MEQ PO TBCR Oral Take 1 tablet (10 mEq total) by mouth daily. 60 tablet 1  . TAMSULOSIN HCL 0.4 MG PO CAPS Oral Take 0.4 mg by mouth daily.        BP 126/91  Pulse 102  Temp 98.4 F (36.9 C) (Oral)  Resp 20  SpO2 99%  Physical Exam Consitutional: Pt in no acute distress.   Head: Normocephalic and atraumatic.  Eyes: Extraocular motion intact, no scleral icterus Neck: Supple  without meningismus, mass, or overt JVD.  No enlarged sublingual or submandibular glands appreciated on palpation.   OPA: Edentulous, otherwise normal oropharynx. Respiratory: Effort normal and breath sounds normal. No respiratory distress. CV: Heart regular rate and regular rhythm (sinus), no obvious murmurs.  Pulses +2 and symmetric Abdomen: Soft, non-tender, non-distended. No rebound or guarding.  MSK: Extremities are atraumatic without deformity, ROM intact Skin: Warm, dry, intact Neuro: Alert and oriented, no motor deficit noted.   Psychiatric: Mood and affect  are normal    ED Course  Procedures (including critical care time)  Labs Reviewed - No data to display No results found.   1. Dry mouth       MDM  Dry mouth. Review of systems negative otherwise, nothing to suggest Sjogren's syndrome or similar.  Patient to followup with family practice medicine.  PT DC home stable.  Discussed with pt the clinical impression, treatment in the ED, and follow up plan.  We alslo discussed the indications for returning to the ED, which include shortness or breath, confusion, fever, new weakness or numbness, chest pain, or any other concerning symptom.  The pt understood the treatment and plan, is stable, and is able to leave the ED.           Larrie Kass, MD 05/26/12 1315

## 2012-05-26 NOTE — ED Provider Notes (Signed)
I saw and evaluated the patient, reviewed the resident's note and I agree with the findings and plan.   Pt with dry mouth, no resp distress, no dysphagia.  No pain or discomfort.  Stable for outpt re-evaluation with PCP.  No emergent condition identified.    Gavin Pound. Sherrel Ploch, MD 05/26/12 1407

## 2012-07-18 ENCOUNTER — Encounter: Payer: Self-pay | Admitting: Family Medicine

## 2012-07-18 ENCOUNTER — Ambulatory Visit (INDEPENDENT_AMBULATORY_CARE_PROVIDER_SITE_OTHER): Payer: Self-pay | Admitting: Family Medicine

## 2012-07-18 VITALS — BP 137/88 | HR 103 | Temp 98.5°F | Ht 70.0 in | Wt 162.0 lb

## 2012-07-18 DIAGNOSIS — L259 Unspecified contact dermatitis, unspecified cause: Secondary | ICD-10-CM

## 2012-07-18 DIAGNOSIS — I10 Essential (primary) hypertension: Secondary | ICD-10-CM

## 2012-07-18 DIAGNOSIS — E785 Hyperlipidemia, unspecified: Secondary | ICD-10-CM

## 2012-07-18 DIAGNOSIS — L239 Allergic contact dermatitis, unspecified cause: Secondary | ICD-10-CM

## 2012-07-18 DIAGNOSIS — E876 Hypokalemia: Secondary | ICD-10-CM

## 2012-07-18 LAB — LIPID PANEL
HDL: 47 mg/dL (ref >39)
LDL Cholesterol: 97 mg/dL (ref 0–99)

## 2012-07-18 LAB — BASIC METABOLIC PANEL
CO2: 31 mEq/L (ref 19–32)
Calcium: 9.6 mg/dL (ref 8.4–10.5)
Sodium: 141 mEq/L (ref 135–145)

## 2012-07-18 MED ORDER — ATENOLOL-CHLORTHALIDONE 50-25 MG PO TABS: 1.0000 | ORAL_TABLET | Freq: Every day | ORAL | Status: DC

## 2012-07-18 NOTE — Progress Notes (Signed)
  Subjective:    Patient ID: Peter Lam, male    DOB: Jul 31, 1946, 66 y.o.   MRN: [id number]  HPI  Peter Lam comes in for follow up.  He says he is overall doing well.    Hypokalemia- he says his urologist checked his potassium, and it was low, so they started him on K-dur, but he has not tolerated the medication well.  He says it upsets his stomach, and he even went to the ER for dry mouth.  He says he took it for about one month, but has now stopped, and he is eating more bananas.   HTN- taking atenolol/chlorthalidone without difficulty.  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   HLD- had elevated LDL about 6 months ago, is not taking any medications right now.  Is fasting today, says his diet has not been especially healthy since his wife died.   Rash- he has some itching around his neck and at his waist band.  He does admit that he went back to an old detergent that he was allergic to because he does not like to throw things away.    Past Medical History  Diagnosis Date  . BPH (benign prostatic hypertrophy) with urinary obstruction     followed by Dr. Annabell Howells at [facility]  . GERD (gastroesophageal reflux disease)   . Hypertension   . Arthritis   . Inguinal hernia    Family History  Problem Relation Age of Onset  . Alzheimer's disease Mother   . Hypertension Father   . Hypertension Brother   . Cancer Maternal Uncle     colon   History  Substance Use Topics  . Smoking status: Former Smoker    Quit date: 12/07/1973  . Smokeless tobacco: Never Used  . Alcohol Use: No   Review of Systems See HPI.    Objective:   Physical Exam BP 137/88  Pulse 103  Temp 98.5 F (36.9 C) (Oral)  Ht 5\' 10"  (1.778 m)  Wt 162 lb (73.483 kg)  BMI 23.24 kg/m2 General appearance: alert, cooperative and no distress Neck: no adenopathy, supple, symmetrical, trachea midline and back of neck  with red papules Lungs: clear to auscultation bilaterally Heart: regular rate and rhythm, S1, S2 normal, no murmur, click, rub or gallop Pulses: 2+ and symmetric Skin: red papules around waist where underwear/pants line is.       Assessment & Plan:

## 2012-07-18 NOTE — Patient Instructions (Signed)
It was nice to meet you.  Your blood pressure today was BP: 137/88 mmHg.  Remember your goal blood pressure is about 130/80.  Please be sure to take your medication every day, and try to minimize the salt in your diet.  I will call you or send you a letter with your lab results.   For the rash- you can try over the counter Hydrocortisone cream, this should help calm it down.   Please come back and see me ina bout 6 months or sooner if needed.

## 2012-07-18 NOTE — Assessment & Plan Note (Signed)
Suspect this is from change in laundry detergent or from a plant he comes in contact with.  Advised stopping current laundry detergent and hydrocortisone topical as needed.

## 2012-07-18 NOTE — Assessment & Plan Note (Signed)
Patient has self d/c'd k-dur.  Will check K+ to see if it has improved with diet change.

## 2012-07-18 NOTE — Assessment & Plan Note (Signed)
Well controlled today, medications refilled without changes.

## 2012-07-18 NOTE — Assessment & Plan Note (Signed)
Will check fasting lipid panel, discuss need for statin based on results.

## 2012-07-19 ENCOUNTER — Encounter: Payer: Self-pay | Admitting: Family Medicine

## 2012-08-14 ENCOUNTER — Other Ambulatory Visit: Payer: Self-pay | Admitting: Family Medicine

## 2012-10-03 ENCOUNTER — Ambulatory Visit (INDEPENDENT_AMBULATORY_CARE_PROVIDER_SITE_OTHER): Payer: Self-pay | Admitting: *Deleted

## 2012-10-03 ENCOUNTER — Other Ambulatory Visit (INDEPENDENT_AMBULATORY_CARE_PROVIDER_SITE_OTHER): Payer: Self-pay

## 2012-10-03 DIAGNOSIS — R7309 Other abnormal glucose: Secondary | ICD-10-CM

## 2012-10-03 DIAGNOSIS — Z23 Encounter for immunization: Secondary | ICD-10-CM

## 2012-10-03 LAB — POCT GLYCOSYLATED HEMOGLOBIN (HGB A1C): Hemoglobin A1C: 5.4

## 2012-10-03 NOTE — Progress Notes (Signed)
A1c: 5.4%

## 2013-01-16 ENCOUNTER — Other Ambulatory Visit: Payer: Self-pay | Admitting: Family Medicine

## 2013-03-16 ENCOUNTER — Encounter: Payer: Self-pay | Admitting: Family Medicine

## 2013-03-16 ENCOUNTER — Ambulatory Visit (INDEPENDENT_AMBULATORY_CARE_PROVIDER_SITE_OTHER): Payer: Self-pay | Admitting: Family Medicine

## 2013-03-16 VITALS — BP 131/87 | HR 80 | Temp 98.3°F | Ht 70.0 in | Wt 154.6 lb

## 2013-03-16 DIAGNOSIS — E876 Hypokalemia: Secondary | ICD-10-CM

## 2013-03-16 DIAGNOSIS — Z0000 Encounter for general adult medical examination without abnormal findings: Secondary | ICD-10-CM

## 2013-03-16 DIAGNOSIS — I10 Essential (primary) hypertension: Secondary | ICD-10-CM

## 2013-03-16 MED ORDER — ATENOLOL-CHLORTHALIDONE 50-25 MG PO TABS: 1.0000 | ORAL_TABLET | Freq: Every day | ORAL | Status: DC

## 2013-03-16 NOTE — Patient Instructions (Signed)
Your blood pressure today was BP: 131/87 mmHg.  Remember your goal blood pressure is about 130/80.  Please be sure to take your medication every day.    I am going to check your potassium to make sure it is not too low. Also, see the attached sheet with foods high in potassium.   I will send you a letter with your lab results, or call you if anything is abnormal.    Please make an appointment in 6 months for your next check up.    Also, please consider getting your colonoscopy done.

## 2013-03-16 NOTE — Assessment & Plan Note (Signed)
Overdue for colonoscopy- he will turn 35 and qualify for medicare in about 3 months, wants to wait until then.

## 2013-03-16 NOTE — Assessment & Plan Note (Signed)
Well controlled, will check BMET and continue current medication.

## 2013-03-16 NOTE — Progress Notes (Signed)
  Subjective:    Patient ID: Peter Lam, male    DOB: Dec 17, 1945, 67 y.o.   MRN: [id number]  HPI:  [name] comes in for a check up.  He says he is doing well.   HTN: Taking atenolol/chlorthalidone without difficulty.  Denies chest pain, dizziness, palpitations, LE edema.  Patient does not check blood pressures.   Hypokalemia: had critical low about a year ago, was on K-dur for a little while but stopped it due to stomach upset and increased his banana intake.   Health Maintenance: UTD on flu and zostavax- has never had colonoscopy.  He says he is still paying off his hernia repair and prostate biopsy but will think about getting one when he turns 67.   Past Medical History  Diagnosis Date  . BPH (benign prostatic hypertrophy) with urinary obstruction     followed by Dr. Annabell Howells at [facility]  . GERD (gastroesophageal reflux disease)   . Hypertension   . Arthritis   . Inguinal hernia     History  Substance Use Topics  . Smoking status: Former Smoker    Quit date: 12/07/1973  . Smokeless tobacco: Never Used  . Alcohol Use: No    Family History  Problem Relation Age of Onset  . Alzheimer's disease Mother   . Hypertension Father   . Hypertension Brother   . Cancer Maternal Uncle     colon     ROS Pertinent items in HPI    Objective:  Physical Exam:  BP 131/87  Pulse 80  Temp(Src) 98.3 F (36.8 C) (Oral)  Ht 5\' 10"  (1.778 m)  Wt 154 lb 9.6 oz (70.126 kg)  BMI 22.18 kg/m2 General appearance: alert, cooperative and no distress Head: Normocephalic, without obvious abnormality, atraumatic Lungs: clear to auscultation bilaterally Heart: regular rate and rhythm, S1, S2 normal, no murmur, click, rub or gallop Pulses: 2+ and symmetric       Assessment & Plan:

## 2013-03-16 NOTE — Assessment & Plan Note (Signed)
Will check BMET- also gave hand out with potassium rich foods since he does not want to take K+ supplements.

## 2013-03-17 ENCOUNTER — Encounter (HOSPITAL_COMMUNITY): Payer: Self-pay | Admitting: Family Medicine

## 2013-03-17 LAB — BASIC METABOLIC PANEL
Chloride: 101 mEq/L (ref 96–112)
Potassium: 3.7 mEq/L (ref 3.5–5.3)
Sodium: 141 mEq/L (ref 135–145)

## 2013-07-14 ENCOUNTER — Other Ambulatory Visit: Payer: Self-pay | Admitting: *Deleted

## 2013-07-15 NOTE — Telephone Encounter (Signed)
Please call patient regarding flomax prescription. Last written in 2011 - is he still needing/using this? If he would like this prescription filled, I can fill it for one month but would like to see him in the office to re-evaluate.  Thanks.  Leona Singleton, MD

## 2013-07-17 NOTE — Telephone Encounter (Signed)
Left a message for pt to call back.  Please give him message when he does and make an appt for him to follow up with pcp.  Thanks [employer]

## 2013-08-11 MED ORDER — TAMSULOSIN HCL 0.4 MG PO CAPS: 0.4000 mg | ORAL_CAPSULE | Freq: Every day | ORAL | Status: DC

## 2013-08-11 NOTE — Telephone Encounter (Signed)
OK thanks for letting me know. Filled for 3 months and will expect to see patient in November.  Leona Singleton, MD

## 2013-08-11 NOTE — Telephone Encounter (Signed)
Pt cant make appt until November due to financial reasons. Would like a refill until then.  Pt states that Dr. Annabell Howells used to fill it for him but he no longer goes there.  Will forward to MD Fleeger, Maryjo Rochester

## 2013-10-18 ENCOUNTER — Ambulatory Visit (INDEPENDENT_AMBULATORY_CARE_PROVIDER_SITE_OTHER): Payer: Medicare Other | Admitting: Family Medicine

## 2013-10-18 ENCOUNTER — Encounter: Payer: Self-pay | Admitting: Family Medicine

## 2013-10-18 VITALS — BP 121/79 | HR 54 | Temp 97.9°F | Wt 147.0 lb

## 2013-10-18 DIAGNOSIS — Z23 Encounter for immunization: Secondary | ICD-10-CM

## 2013-10-18 DIAGNOSIS — I10 Essential (primary) hypertension: Secondary | ICD-10-CM

## 2013-10-18 DIAGNOSIS — N401 Enlarged prostate with lower urinary tract symptoms: Secondary | ICD-10-CM

## 2013-10-18 MED ORDER — ATENOLOL-CHLORTHALIDONE 50-25 MG PO TABS: 1.0000 | ORAL_TABLET | Freq: Every day | ORAL | Status: DC

## 2013-10-18 MED ORDER — TAMSULOSIN HCL 0.4 MG PO CAPS: 0.4000 mg | ORAL_CAPSULE | Freq: Every day | ORAL | Status: DC

## 2013-10-18 NOTE — Progress Notes (Signed)
Patient ID: Peter Lam, male   DOB: 12-03-1946, 67 y.o.   MRN: [id number] Subjective:   CC: Meet new MD  HPI:   1. Patient is here to meet new MD.  He denies current complaints.  He would like a flu shot.  2. Needs med refills - Pt would like refill and medication information for tamsulosin. He takes this for BPH daily. He denies SE other than strong erection many nights that does not last long and does not affect his voiding and is not painful.  Review of Systems - Per HPI.   PMH: Medications reviewed - Not taking ASA because irritated stomach in the past - left on list as pt would like to start taking this again slowly after discussing risks/benefits. 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 male here to establish care and get refills on atenolol-chlorthalidone and tamsulosin.    Plan:     # See problem list and after visit summary for problem-specific plans.   # Health maintenance - - Flu shot today - Return when convenient to evaluate - Will restart aspirin slowly and watch out for tinnitus which had previously - PHQ-2 is 0  FOLLOW-UP: - when convenient for health maintenance visit  Leona Singleton, MD [facility]

## 2013-10-18 NOTE — Patient Instructions (Signed)
It was great to meet you.  I have filled your tamsulosin and bp medication. Come back when convenient for you for a Health Maintenance visit. You are getting a flu shot today. Start aspirin but start slowly and stop if you get any ringing in your ears (and let me know).   Tamsulosin capsules What is this medicine? TAMSULOSIN (tam SOO loe sin) is used to treat enlargement of the prostate gland in men, a condition called benign prostatic hyperplasia or BPH. It is not for use in women. It works by relaxing muscles in the prostate and bladder neck. This improves urine flow and reduces BPH symptoms. This medicine may be used for other purposes; ask your health care provider or pharmacist if you have questions. COMMON BRAND NAME(S): Flomax What should I tell my health care provider before I take this medicine? They need to know if you have any of the following conditions: -advanced kidney disease -advanced liver disease -low blood pressure -prostate cancer -an unusual or allergic reaction to tamsulosin, sulfa drugs, other medicines, foods, dyes, or preservatives -pregnant or trying to get pregnant -breast-feeding How should I use this medicine? Take this medicine by mouth about 30 minutes after the same meal every day. Follow the directions on the prescription label. Swallow the capsules whole with a glass of water. Do not crush, chew, or open capsules. Do not take your medicine more often than directed. Do not stop taking your medicine unless your doctor tells you to. Talk to your pediatrician regarding the use of this medicine in children. Special care may be needed. Overdosage: If you think you have taken too much of this medicine contact a poison control center or emergency room at once. NOTE: This medicine is only for you. Do not share this medicine with others. What if I miss a dose? If you miss a dose, take it as soon as you can. If it is almost time for your next dose, take only that  dose. Do not take double or extra doses. If you stop taking your medicine for several days or more, ask your doctor or health care professional what dose you should start back on. What may interact with this medicine? -cimetidine -fluoxetine -ketoconazole -medicines for erectile disfunction like sildenafil, tadalafil, vardenafil -medicines for high blood pressure -other alpha-blockers like alfuzosin, doxazosin, phentolamine, phenoxybenzamine, prazosin, terazosin -warfarin This list may not describe all possible interactions. Give your health care provider a list of all the medicines, herbs, non-prescription drugs, or dietary supplements you use. Also tell them if you smoke, drink alcohol, or use illegal drugs. Some items may interact with your medicine. What should I watch for while using this medicine? Visit your doctor or health care professional for regular check ups. You will need lab work done before you start this medicine and regularly while you are taking it. Check your blood pressure as directed. Ask your health care professional what your blood pressure should be, and when you should contact him or her. This medicine may make you feel dizzy or lightheaded. This is more likely to happen after the first dose, after an increase in dose, or during hot weather or exercise. Drinking alcohol and taking some medicines can make this worse. Do not drive, use machinery, or do anything that needs mental alertness until you know how this medicine affects you. Do not sit or stand up quickly. If you begin to feel dizzy, sit down until you feel better. These effects can decrease once your body adjusts  to the medicine. Contact your doctor or health care professional right away if you have an erection that lasts longer than 4 hours or if it becomes painful. This may be a sign of a serious problem and must be treated right away to prevent permanent damage. If you are thinking of having cataract surgery, tell  your eye surgeon that you have taken this medicine. What side effects may I notice from receiving this medicine? Side effects that you should report to your doctor or health care professional as soon as possible: -allergic reactions like skin rash or itching, hives, swelling of the lips, mouth, tongue, or throat -breathing problems -change in vision -feeling faint or lightheaded -irregular heartbeat -prolonged or painful erection -weakness Side effects that usually do not require medical attention (report to your doctor or health care professional if they continue or are bothersome): -back pain -change in sex drive or performance -constipation, nausea or vomiting -cough -drowsy -runny or stuffy nose -trouble sleeping This list may not describe all possible side effects. Call your doctor for medical advice about side effects. You may report side effects to FDA at 1-800-FDA-1088. Where should I keep my medicine? Keep out of the reach of children. Store at room temperature between 15 and 30 degrees C (59 and 86 degrees F). Throw away any unused medicine after the expiration date. NOTE: This sheet is a summary. It may not cover all possible information. If you have questions about this medicine, talk to your doctor, pharmacist, or health care provider.  2014, Elsevier/Gold Standard. (2012-11-23 14:11:34)

## 2013-10-19 ENCOUNTER — Encounter: Payer: Self-pay | Admitting: Family Medicine

## 2013-10-19 NOTE — Assessment & Plan Note (Signed)
Stable BP at this visit. - Refilled atenolol-chlorthalidone

## 2013-10-19 NOTE — Assessment & Plan Note (Signed)
Symptoms well-controlled on tamsulosin - BP stable - Refilled - Return precautions reviewed; med info printed per pt request

## 2014-01-16 ENCOUNTER — Emergency Department (HOSPITAL_COMMUNITY)
Admission: EM | Admit: 2014-01-16 | Discharge: 2014-01-16 | Disposition: A | Discharge status: Home / Self Care | Payer: Medicare Other | Attending: Emergency Medicine | Admitting: Emergency Medicine

## 2014-01-16 ENCOUNTER — Encounter (HOSPITAL_COMMUNITY): Payer: Self-pay | Admitting: Emergency Medicine

## 2014-01-16 DIAGNOSIS — R3919 Other difficulties with micturition: Secondary | ICD-10-CM | POA: Insufficient documentation

## 2014-01-16 DIAGNOSIS — N138 Other obstructive and reflux uropathy: Secondary | ICD-10-CM | POA: Insufficient documentation

## 2014-01-16 DIAGNOSIS — M129 Arthropathy, unspecified: Secondary | ICD-10-CM | POA: Insufficient documentation

## 2014-01-16 DIAGNOSIS — Z79899 Other long term (current) drug therapy: Secondary | ICD-10-CM | POA: Insufficient documentation

## 2014-01-16 DIAGNOSIS — Z9889 Other specified postprocedural states: Secondary | ICD-10-CM | POA: Insufficient documentation

## 2014-01-16 DIAGNOSIS — Z87891 Personal history of nicotine dependence: Secondary | ICD-10-CM | POA: Insufficient documentation

## 2014-01-16 DIAGNOSIS — N401 Enlarged prostate with lower urinary tract symptoms: Secondary | ICD-10-CM | POA: Insufficient documentation

## 2014-01-16 DIAGNOSIS — Z7982 Long term (current) use of aspirin: Secondary | ICD-10-CM | POA: Insufficient documentation

## 2014-01-16 DIAGNOSIS — R339 Retention of urine, unspecified: Secondary | ICD-10-CM | POA: Insufficient documentation

## 2014-01-16 DIAGNOSIS — Z8719 Personal history of other diseases of the digestive system: Secondary | ICD-10-CM | POA: Insufficient documentation

## 2014-01-16 DIAGNOSIS — I10 Essential (primary) hypertension: Secondary | ICD-10-CM | POA: Insufficient documentation

## 2014-01-16 LAB — URINALYSIS, ROUTINE W REFLEX MICROSCOPIC
Bilirubin Urine: NEGATIVE
GLUCOSE, UA: NEGATIVE mg/dL
HGB URINE DIPSTICK: NEGATIVE
Ketones, ur: NEGATIVE mg/dL
Leukocytes, UA: NEGATIVE
Nitrite: NEGATIVE
Protein, ur: NEGATIVE mg/dL
Specific Gravity, Urine: 1.005 (ref 1.005–1.030)
Urobilinogen, UA: 0.2 mg/dL (ref 0.0–1.0)
pH: 6.5 (ref 5.0–8.0)

## 2014-01-16 NOTE — ED Provider Notes (Signed)
CSN: [id number]     Arrival date & time 01/16/14  0134 History   First MD Initiated Contact with Patient 01/16/14 0403     Chief Complaint  Patient presents with  . Urinary Retention     (Consider location/radiation/quality/duration/timing/severity/associated sxs/prior Treatment) HPI Comments: Pt presents with some urinary retention which he describes as hesitancy and difficulty starting a urinary stream. He denies fevers chills nausea vomiting or abdominal pain or penis pain or testicular or scrotal pain. He denies injuries, denies medication changes and has been taking his Flomax as prescribed by family doctor. He has seen a urologist in the past, not recently.  The history is provided by the patient.    Past Medical History  Diagnosis Date  . BPH (benign prostatic hypertrophy) with urinary obstruction     followed by Dr. Jeffie Pollock at [facility]  . GERD (gastroesophageal reflux disease)   . Hypertension   . Arthritis   . Inguinal hernia    Past Surgical History  Procedure Laterality Date  . Tonsillectomy and adenoidectomy    . Prostate biopsy    . Multiple tooth extractions    . Inguinal hernia repair  01/15/2012    Procedure: HERNIA REPAIR INGUINAL ADULT;  Surgeon: Earnstine Regal, MD;  Location: [facility];  Service: General;  Laterality: Left;  Left inguinal hernia with mesh    Family History  Problem Relation Age of Onset  . Alzheimer's disease Mother   . Hypertension Father   . Hypertension Brother   . Cancer Maternal Uncle     colon   History  Substance Use Topics  . Smoking status: Former Smoker    Quit date: 12/07/1973  . Smokeless tobacco: Never Used  . Alcohol Use: No    Review of Systems  Constitutional: Negative for fever.  Genitourinary: Positive for difficulty urinating. Negative for dysuria.      Allergies  Ace inhibitors and Diltiazem hcl  Home Medications   Current Outpatient Rx  Name  Route  Sig  Dispense  Refill  .  aspirin EC 81 MG tablet   Oral   Take 81 mg by mouth daily.         Marland Kitchen atenolol-chlorthalidone (TENORETIC) 50-25 MG per tablet   Oral   Take 1 tablet by mouth daily.   30 tablet   5   . tamsulosin (FLOMAX) 0.4 MG CAPS capsule   Oral   Take 1 capsule (0.4 mg total) by mouth daily.   30 capsule   5    BP 144/90  Pulse 89  Temp(Src) 97.5 F (36.4 C) (Oral)  Resp 20  Ht 5\' 10"  (1.778 m)  Wt 145 lb (65.772 kg)  BMI 20.81 kg/m2  SpO2 100% Physical Exam  Nursing note and vitals reviewed. Constitutional: He appears well-developed and well-nourished. No distress.  HENT:  Head: Normocephalic and atraumatic.  Mouth/Throat: Oropharynx is clear and moist. No oropharyngeal exudate.  Eyes: Conjunctivae and EOM are normal. Pupils are equal, round, and reactive to light. Right eye exhibits no discharge. Left eye exhibits no discharge. No scleral icterus.  Neck: Normal range of motion. Neck supple. No JVD present. No thyromegaly present.  Cardiovascular: Normal rate, regular rhythm, normal heart sounds and intact distal pulses.  Exam reveals no gallop and no friction rub.   No murmur heard. Pulmonary/Chest: Effort normal and breath sounds normal. No respiratory distress. He has no wheezes. He has no rales.  Abdominal: Soft. Bowel sounds are normal. He exhibits no  distension and no mass. There is no tenderness.  No abdominal tenderness or fullness  Genitourinary:  Normal-appearing penis scrotum and testicles, no hernias present  Musculoskeletal: Normal range of motion. He exhibits no edema and no tenderness.  Lymphadenopathy:    He has no cervical adenopathy.  Neurological: He is alert. Coordination normal.  Skin: Skin is warm and dry. No rash noted. No erythema.  Psychiatric: He has a normal mood and affect. His behavior is normal.    ED Course  Procedures (including critical care time) Labs Review Labs Reviewed  URINALYSIS, ROUTINE W REFLEX MICROSCOPIC - Abnormal; Notable for the  following:    APPearance CLOUDY (*)    All other components within normal limits   Imaging Review No results found.  EKG Interpretation   None       MDM   Final diagnoses:  Urinary retention    Patient appears benign, vital signs are unremarkable, no fever, no tachycardia, no hypotension. Urinalysis is clean no infection, he has been able to pass urine without difficulty here. We'll discharge him   Johnna Acosta, MD 01/16/14 [phone]

## 2014-01-16 NOTE — ED Notes (Signed)
Pt reports that he has had urinary retention since Sunday afternoon, increasing with difficulty tonight. Pt states he is unable to fully empty his bladder. Pt has hx of an enlarged prostate.

## 2014-01-16 NOTE — Discharge Instructions (Signed)
Your testing is normal - see your Urologist this week for recheck if symptoms persist.

## 2014-03-06 ENCOUNTER — Other Ambulatory Visit: Payer: Self-pay | Admitting: *Deleted

## 2014-03-06 MED ORDER — ATENOLOL-CHLORTHALIDONE 50-25 MG PO TABS: 1.0000 | ORAL_TABLET | Freq: Every day | ORAL | Status: DC

## 2014-05-07 ENCOUNTER — Other Ambulatory Visit: Payer: Self-pay | Admitting: *Deleted

## 2014-05-07 MED ORDER — ATENOLOL-CHLORTHALIDONE 50-25 MG PO TABS: 1.0000 | ORAL_TABLET | Freq: Every day | ORAL | Status: DC

## 2014-05-07 MED ORDER — TAMSULOSIN HCL 0.4 MG PO CAPS: 0.4000 mg | ORAL_CAPSULE | Freq: Every day | ORAL | Status: DC

## 2014-05-07 MED ORDER — TAMSULOSIN HCL 0.4 MG PO CAPS
0.4000 mg | ORAL_CAPSULE | Freq: Every day | ORAL | Status: DC
Start: 2014-05-07 — End: 2014-05-07

## 2014-05-07 NOTE — Addendum Note (Signed)
Addended by: Conni Slipper T on: 05/07/2014 05:10 PM   Modules accepted: Orders

## 2014-05-14 ENCOUNTER — Ambulatory Visit: Payer: Medicare Other | Admitting: Family Medicine

## 2014-05-25 ENCOUNTER — Ambulatory Visit (INDEPENDENT_AMBULATORY_CARE_PROVIDER_SITE_OTHER): Payer: Medicare Other | Admitting: Family Medicine

## 2014-05-25 ENCOUNTER — Encounter: Payer: Self-pay | Admitting: Family Medicine

## 2014-05-25 VITALS — BP 115/77 | HR 78 | Temp 98.3°F | Wt 145.0 lb

## 2014-05-25 DIAGNOSIS — Z833 Family history of diabetes mellitus: Secondary | ICD-10-CM

## 2014-05-25 DIAGNOSIS — I10 Essential (primary) hypertension: Secondary | ICD-10-CM

## 2014-05-25 LAB — BASIC METABOLIC PANEL
BUN: 7 mg/dL (ref 6–23)
CO2: 31 mEq/L (ref 19–32)
Calcium: 9.5 mg/dL (ref 8.4–10.5)
Chloride: 102 mEq/L (ref 96–112)
Creat: 0.89 mg/dL (ref 0.50–1.35)
Glucose, Bld: 86 mg/dL (ref 70–99)
Potassium: 3.1 mEq/L — ABNORMAL LOW (ref 3.5–5.3)
Sodium: 143 mEq/L (ref 135–145)

## 2014-05-25 LAB — POCT GLYCOSYLATED HEMOGLOBIN (HGB A1C): Hemoglobin A1C: 5.2

## 2014-05-25 MED ORDER — ATENOLOL-CHLORTHALIDONE 50-25 MG PO TABS: 1.0000 | ORAL_TABLET | Freq: Every day | ORAL | Status: DC

## 2014-05-25 MED ORDER — ASPIRIN EC 81 MG PO TBEC: 81.0000 mg | DELAYED_RELEASE_TABLET | Freq: Every day | ORAL | Status: DC

## 2014-05-25 NOTE — Patient Instructions (Signed)
Follow up with me when convenient for your health maintenance visit. We are getting labs today and I will call if anything is NOT normal or send you a letter either way.

## 2014-05-25 NOTE — Progress Notes (Signed)
Patient ID: Peter Lam, male   DOB: 1945/12/15, 68 y.o.   MRN: [id number] Subjective:   CC: HTN follow up  HPI:   Hypertension follow up Patient needs refill on atenolol-chlorthalidone. He has been taking it daily and denies side effects. He also denies chest pain, dyspnea, leg swelling, or dizziness/syncope. He does not have a BP cuff at home so does not check BP. He stopped taking aspirin because he ran out, but denies side effects. He denies h/o bleeding on this but does have occasional issues with GERD.  Review of Systems - Per HPI.  FH: PGM, niece and nephews had diabetes  SH: Quit smoking/drinking 35 years ago. Works in Teacher, adult education care.    Objective:  Physical Exam BP 115/77  Pulse 78  Temp(Src) 98.3 F (36.8 C) (Oral)  Wt 145 lb (65.772 kg) GEN: NAD, pleasant CV: RRR, no m/r/g, 2+ bilateral radial pulse, 1+ billateral DP pulses PULM: CTAB, normal effort EXTR: No LE edema or calf tenderness NEURO: Awake, alert, no focal deficit, normal speech and gait PSYCH: Mood and affect euthymic     Assessment:     Peter Lam is a 68 y.o. male with h/o HTN, HLD, and BPH here for follow up of HTN.    Plan:     Hypertension follow up BP well-controlled today. No medication side effects. Last creatinine checked 03/2013 was normal. Walks occasionally. - Continue chlorthalidone-atenolol. Refilled today to pt's new pharmacy. - Restart aspirin 81mg  and watch out for bleeding or stomach irritation. - BMET today, send letter with results per pt request, will call if anything abnormal. - With FH of diabetes, will also check A1c. - Encouraged occasional walking and congratulated on smoking cessation.  # Health Maintenance: Needs visit. - Make appt when convenient.  Follow-up: Follow up when convenient for health maintenance visit.   Hilton Sinclair, MD [place]

## 2014-05-25 NOTE — Assessment & Plan Note (Signed)
BP well-controlled today. No medication side effects. Last creatinine checked 03/2013 was normal. Walks occasionally. - Continue chlorthalidone-atenolol. Refilled today to pt's new pharmacy. - Restart aspirin 81mg  and watch out for bleeding or stomach irritation. - BMET today, send letter with results per pt request, will call if anything abnormal. - With FH of diabetes, will also check A1c. - Encouraged occasional walking and congratulated on smoking cessation.

## 2014-06-10 ENCOUNTER — Encounter: Payer: Self-pay | Admitting: Family Medicine

## 2015-05-02 ENCOUNTER — Emergency Department (HOSPITAL_COMMUNITY)
Admission: EM | Admit: 2015-05-02 | Discharge: 2015-05-02 | Disposition: A | Discharge status: Home / Self Care | Payer: Medicare Other | Attending: Emergency Medicine | Admitting: Emergency Medicine

## 2015-05-02 ENCOUNTER — Encounter (HOSPITAL_COMMUNITY): Payer: Self-pay | Admitting: Emergency Medicine

## 2015-05-02 DIAGNOSIS — Z87891 Personal history of nicotine dependence: Secondary | ICD-10-CM | POA: Insufficient documentation

## 2015-05-02 DIAGNOSIS — M1990 Unspecified osteoarthritis, unspecified site: Secondary | ICD-10-CM | POA: Diagnosis not present

## 2015-05-02 DIAGNOSIS — I10 Essential (primary) hypertension: Secondary | ICD-10-CM | POA: Diagnosis not present

## 2015-05-02 DIAGNOSIS — Z79899 Other long term (current) drug therapy: Secondary | ICD-10-CM | POA: Diagnosis not present

## 2015-05-02 DIAGNOSIS — Z8719 Personal history of other diseases of the digestive system: Secondary | ICD-10-CM | POA: Insufficient documentation

## 2015-05-02 DIAGNOSIS — N401 Enlarged prostate with lower urinary tract symptoms: Secondary | ICD-10-CM | POA: Diagnosis not present

## 2015-05-02 DIAGNOSIS — R3912 Poor urinary stream: Secondary | ICD-10-CM

## 2015-05-02 DIAGNOSIS — Z7982 Long term (current) use of aspirin: Secondary | ICD-10-CM | POA: Diagnosis not present

## 2015-05-02 DIAGNOSIS — R339 Retention of urine, unspecified: Secondary | ICD-10-CM | POA: Diagnosis present

## 2015-05-02 LAB — URINALYSIS, ROUTINE W REFLEX MICROSCOPIC
Bilirubin Urine: NEGATIVE
Glucose, UA: NEGATIVE mg/dL
HGB URINE DIPSTICK: NEGATIVE
Ketones, ur: NEGATIVE mg/dL
LEUKOCYTES UA: NEGATIVE
NITRITE: NEGATIVE
Protein, ur: NEGATIVE mg/dL
Specific Gravity, Urine: 1.003 — ABNORMAL LOW (ref 1.005–1.030)
UROBILINOGEN UA: 0.2 mg/dL (ref 0.0–1.0)
pH: 7 (ref 5.0–8.0)

## 2015-05-02 MED ORDER — LIDOCAINE HCL 2 % EX GEL: 1.0000 "application " | Freq: Once | CUTANEOUS | Status: DC

## 2015-05-02 NOTE — Discharge Instructions (Signed)
Restart taking your Flomax. Follow-up with your primary doctor. Follow-up with your urologist at your scheduled appointment. Return to the emergency department as needed if symptoms worsen.  Benign Prostatic Hyperplasia An enlarged prostate (benign prostatic hyperplasia) is common in older men. You may experience the following:  Weak urine stream.  Dribbling.  Feeling like the bladder has not emptied completely.  Difficulty starting urination.  Getting up frequently at night to urinate.  Urinating more frequently during the day. HOME CARE INSTRUCTIONS  Monitor your prostatic hyperplasia for any changes. The following actions may help to alleviate any discomfort you are experiencing:  Give yourself time when you urinate.  Stay away from alcohol.  Avoid beverages containing caffeine, such as coffee, tea, and colas, because they can make the problem worse.  Avoid decongestants, antihistamines, and some prescription medicines that can make the problem worse.  Follow up with your health care provider for further treatment as recommended. SEEK MEDICAL CARE IF:  You are experiencing progressive difficulty voiding.  Your urine stream is progressively getting narrower.  You are awaking from sleep with the urge to void more frequently.  You are constantly feeling the need to void.  You experience loss of urine, especially in small amounts. SEEK IMMEDIATE MEDICAL CARE IF:   You develop increased pain with urination or are unable to urinate.  You develop severe abdominal pain, vomiting, a high fever, or fainting.  You develop back pain or blood in your urine. MAKE SURE YOU:   Understand these instructions.  Will watch your condition.  Will get help right away if you are not doing well or get worse. Document Released: 11/23/2005 Document Revised: 07/26/2013 Document Reviewed: 04/25/2013 [facility] Patient Information 2015 [place], [place]. This information is not intended to  replace advice given to you by your health care provider. Make sure you discuss any questions you have with your health care provider.

## 2015-05-02 NOTE — ED Notes (Signed)
Pt states every time he tries to urinate for the last two weeks it's been burning and making his bladder sore. States today he's been having trouble with urinary hesitancy and only getting a few drops out whenever he's been trying. Pt denies fever/chills, N/V/D. States he felt a little fatigued yesterday but thought it may have just been from mowing

## 2015-05-02 NOTE — ED Provider Notes (Signed)
CSN: [id number]     Arrival date & time 05/02/15  1948 History  This chart was scribed for Antonietta Breach, PA-C working with Pryor Curia, MD, by Mercy Moore, ED Scribe. This patient was seen in room WTR5/WTR5 and the patient's care was started at 10:07 PM.   Chief Complaint  Patient presents with  . Urinary Retention  . Dysuria   The history is provided by the patient. No language interpreter was used.   HPI Comments: Peter Lam is a 69 y.o. male with PMHx inlcuding benign prostatic hypertrophy and Hypertension who presents to the Emergency Department complaining of urinary symptoms including dysuria/burning with urination, hesitancy, and decreased urine ongoing for 2-3 weeks now. Patient reports worsening urinary symptoms since he stopped taking Flomax due to cost and adverse side effects 3 weeks ago. Patient denies penile discharge, scrotal swelling or testicular pain. Patient reports upcoming appointment with his urologist July 18th. Patient states his Flomax is prescribed by his PCP.   Past Medical History  Diagnosis Date  . BPH (benign prostatic hypertrophy) with urinary obstruction     followed by Dr. Jeffie Pollock at [facility]  . GERD (gastroesophageal reflux disease)   . Hypertension   . Arthritis   . Inguinal hernia    Past Surgical History  Procedure Laterality Date  . Tonsillectomy and adenoidectomy    . Prostate biopsy    . Multiple tooth extractions    . Inguinal hernia repair  01/15/2012    Procedure: HERNIA REPAIR INGUINAL ADULT;  Surgeon: Earnstine Regal, MD;  Location: [facility];  Service: General;  Laterality: Left;  Left inguinal hernia with mesh    Family History  Problem Relation Age of Onset  . Alzheimer's disease Mother   . Hypertension Father   . Hypertension Brother   . Cancer Maternal Uncle     colon   History  Substance Use Topics  . Smoking status: Former Smoker    Quit date: 12/07/1973  . Smokeless tobacco: Never Used  . Alcohol  Use: No    Review of Systems  Constitutional: Negative for fever and chills.  Gastrointestinal: Negative for nausea, vomiting and abdominal pain.  Genitourinary: Positive for dysuria and decreased urine volume. Negative for hematuria, discharge, scrotal swelling and testicular pain.  All other systems reviewed and are negative.   Allergies  Ace inhibitors and Diltiazem hcl  Home Medications   Prior to Admission medications   Medication Sig Start Date End Date Taking? Authorizing Provider  atenolol-chlorthalidone (TENORETIC) 50-25 MG per tablet Take 1 tablet by mouth daily. 05/25/14  Yes Hilton Sinclair, MD  tamsulosin (FLOMAX) 0.4 MG CAPS capsule Take 1 capsule (0.4 mg total) by mouth daily. Need MD appt for more refills. 05/07/14  Yes Hilton Sinclair, MD  aspirin EC 81 MG tablet Take 1 tablet (81 mg total) by mouth daily. 05/25/14   Hilton Sinclair, MD   Triage Vitals: BP 129/92 mmHg  Pulse 64  Temp(Src) 98.4 F (36.9 C) (Oral)  SpO2 100%  Physical Exam  Constitutional: He is oriented to person, place, and time. He appears well-developed and well-nourished. No distress.  Nontoxic/nonseptic appearing  HENT:  Head: Normocephalic and atraumatic.  Eyes: Conjunctivae and EOM are normal. No scleral icterus.  Neck: Normal range of motion.  Pulmonary/Chest: Effort normal. No respiratory distress.  Respirations even and unlabored  Abdominal: Soft. He exhibits no distension. There is no tenderness. There is no rebound and no guarding. Hernia confirmed negative in  the right inguinal area and confirmed negative in the left inguinal area.  Soft, nontender abdomen. No masses or peritoneal signs.  Genitourinary: Testes normal and penis normal. Right testis shows no mass, no swelling and no tenderness. Right testis is descended. Left testis shows no mass, no swelling and no tenderness. Left testis is descended. Uncircumcised. No phimosis, paraphimosis, penile erythema or penile  tenderness. No discharge found.  Chaperoned genital exam is unremarkable.  Musculoskeletal: Normal range of motion.  Neurological: He is alert and oriented to person, place, and time. He exhibits normal muscle tone. Coordination normal.  GCS 15. Speech is goal oriented. Patient moves extremities without ataxia.  Skin: Skin is warm and dry. No rash noted. He is not diaphoretic. No erythema. No pallor.  Psychiatric: He has a normal mood and affect. His behavior is normal.  Nursing note and vitals reviewed.   ED Course  Procedures (including critical care time)  COORDINATION OF CARE: 10:16 PM- Patient states he will have his Flomax prescription filled tomorrow and agrees to follow up with his urologist. Discussed treatment plan with patient at bedside and patient agreed to plan.   Labs Review Labs Reviewed  URINALYSIS, ROUTINE W REFLEX MICROSCOPIC (NOT AT [facility]) - Abnormal; Notable for the following:    Specific Gravity, Urine 1.003 (*)    All other components within normal limits    Imaging Review No results found.   EKG Interpretation None      MDM   Final diagnoses:  Benign prostatic hypertrophy (BPH) with weak urinary stream    69 year old male presents to the emergency department for further evaluation of decreased urinary stream. Patient has a history of BPH and reports discontinuing his Flomax 3 weeks ago secondary to medication cost. Patient has been able to void 2 in the emergency department. He has a nontender abdomen without peritoneal signs. Urinalysis does not suggest infection. Symptoms likely secondary to BPH and medication noncompliance. Given patient's ability to void, do not believe further emergent workup is indicated. Will refer to primary doctor and urology. Have advised patient to restart this medication. Return precautions discussed and provided. Patient agreeable to plan with no unaddressed concerns.  I personally performed the services described in this  documentation, which was scribed in my presence. The recorded information has been reviewed and is accurate.   Filed Vitals:   05/02/15 2105 05/02/15 2231  BP: 129/92 123/78  Pulse: 64 60  Temp: 98.4 F (36.9 C)   TempSrc: Oral   Resp:  18  SpO2: 100% 100%      Antonietta Breach, PA-C 05/02/15 [place], DO 05/02/15 2328

## 2015-06-12 ENCOUNTER — Ambulatory Visit (INDEPENDENT_AMBULATORY_CARE_PROVIDER_SITE_OTHER): Payer: Medicare Other | Admitting: Family Medicine

## 2015-06-12 ENCOUNTER — Encounter: Payer: Self-pay | Admitting: Family Medicine

## 2015-06-12 VITALS — BP 142/85 | HR 104 | Temp 98.2°F | Ht 70.0 in | Wt 153.9 lb

## 2015-06-12 DIAGNOSIS — N401 Enlarged prostate with lower urinary tract symptoms: Secondary | ICD-10-CM | POA: Diagnosis not present

## 2015-06-12 DIAGNOSIS — E785 Hyperlipidemia, unspecified: Secondary | ICD-10-CM

## 2015-06-12 DIAGNOSIS — N138 Other obstructive and reflux uropathy: Secondary | ICD-10-CM

## 2015-06-12 DIAGNOSIS — I10 Essential (primary) hypertension: Secondary | ICD-10-CM | POA: Diagnosis not present

## 2015-06-12 LAB — BASIC METABOLIC PANEL WITH GFR
BUN: 4 mg/dL — ABNORMAL LOW (ref 6–23)
CALCIUM: 9.1 mg/dL (ref 8.4–10.5)
CHLORIDE: 102 meq/L (ref 96–112)
CO2: 29 meq/L (ref 19–32)
Creat: 0.86 mg/dL (ref 0.50–1.35)
GFR, Est African American: >89 mL/min
GFR, Est Non African American: >89 mL/min
Glucose, Bld: 100 mg/dL — ABNORMAL HIGH (ref 70–99)
Potassium: 3.2 mEq/L — ABNORMAL LOW (ref 3.5–5.3)
SODIUM: 142 meq/L (ref 135–145)

## 2015-06-12 LAB — LIPID PANEL
CHOL/HDL RATIO: 3 ratio
Cholesterol: 172 mg/dL (ref 0–200)
HDL: 58 mg/dL (ref >=40)
LDL Cholesterol: 102 mg/dL — ABNORMAL HIGH (ref 0–99)
TRIGLYCERIDES: 58 mg/dL (ref <150)
VLDL: 12 mg/dL (ref 0–40)

## 2015-06-12 MED ORDER — TAMSULOSIN HCL 0.4 MG PO CAPS: 0.4000 mg | ORAL_CAPSULE | Freq: Every day | ORAL | Status: DC

## 2015-06-12 MED ORDER — ATENOLOL-CHLORTHALIDONE 50-25 MG PO TABS: 1.0000 | ORAL_TABLET | Freq: Every day | ORAL | Status: DC

## 2015-06-12 NOTE — Assessment & Plan Note (Signed)
Elevated BP but near goal, <140/90, likely due to out of anti-HTN med >1 month No complications   Plan:  1. Refilled Atenolol-Chlorthalidone 50-25mg  daily, #30 with 11 refills 2. Check BMET / CMET, follow serum Cr 3. Lifestyle Mods - Continue good dietary and exercise regimen. Remain smoke free. 4. Monitor BP at home or at drug store occasionally. 5. RTC 6-12 months for BP

## 2015-06-12 NOTE — Progress Notes (Signed)
   Subjective:    Patient ID: Peter Lam, male    DOB: February 01, 1946, 69 y.o.   MRN: [id number]  Patient presents for annual physical.  HPI  CHRONIC HTN: Reports - out of BP meds x 1 month. Has an old BP cuff no longer uses, previously checked in ED was controlled. Current Meds - Atenolol-Chlorthalidone 50-25mg  daily   Reports good compliance, took meds today. Tolerating well, w/o complaints. Lifestyle - Reduces salt / sodium intake, avoids canned foods if he can. Continues to walk regularly for about 4-5 mi, 5x weekly Denies CP, dyspnea, HA, edema, dizziness / lightheadedness  BPH - Followed by [facility] (Dr. Irine Seal). Previous dx BPH >30 years ago. History of prior prostate biopsy 5 years ago (normal), check-up last had DRE 1 year ago was told to be stable. - Family hx cousin with prostate CA but does not know father's medical history (passed early). - Reported 1 month ago he went to [facility] due to urinary retention due to BPH obstruction, stated he had run out of Tamsulosin x 1 month, recently got a 1 month refill, now out again today. - Next follow-up 7/18, plans to discuss potential surgical options to reduce prostate size, seeding, microwave therapy - No new complaints today. - Wants to check PSA today, can't get blood work at [facility]  HLD: - Fasting today, request cholesterol check. - Not currently on statin therapy  HM: - Offered TDap (declined) - Plans to pursue Colonoscopy (via [facility]) has not scheduled yet, waiting until follows up with Urology to see if doing Prostate procedure first. Never had colonoscopy.  I have reviewed and updated the following as appropriate: allergies and current medications  Social Hx: - Former smoker quit >30 and quit alcohol  Review of Systems  See above HPI    Objective:   Physical Exam  BP 142/85 mmHg  Pulse 104  Temp(Src) 98.2 F (36.8 C) (Oral)  Ht 5\' 10"  (1.778 m)  Wt 153 lb 14.4 oz (69.809 kg)  BMI 22.08  kg/m2  Gen - thin, elderly AAM, well-appearing, comfortable, NAD HEENT - NCAT, oropharynx clear, MMM Neck - supple, non-tender, no LAD, no thyromegaly Heart - Mildly tachycardic, regular rhythm, no murmurs heard Lungs - CTAB, no wheezing, crackles, or rhonchi. Normal work of breathing. Abd - soft, NTND, no masses, +active BS Ext - non-tender, no edema, peripheral pulses intact +2 b/l GU/Rectal - DRE deferred today Skin - warm, dry, no rashes Neuro - awake, alert, oriented, grossly non-focal, intact muscle strength 5/5 b/l grip and dorsiflexion, intact distal sensation to light touch, gait normal     Assessment & Plan:   See specific A&P problem list for details.

## 2015-06-12 NOTE — Assessment & Plan Note (Signed)
Stable, chronic BPH >30 years, known h/o previous urinary obstruction. Currently voiding well, needs refill on Flomax.  Plan: 1. Refilled Flomax 0.4mg  daily, given +11 refills 2. Check PSA today - per pt request, will mail results to pt (advised to bring to Urologist) 3. Follow closely with [facility] (Dr. Jeffie Pollock), consider potential surgical options to reduce prostate size

## 2015-06-12 NOTE — Patient Instructions (Addendum)
Dear [name], Thank you for coming in to clinic today. It was good to meet you!  1. Refilled your BP med and Tamsulosin today - 1 month supply with 11 refills, so you are good for 1 year. Call if you need anything else. 2. Keep up the good work with diet, exercise, and taking baby aspirin daily. 3. We will check basic blood work, cholesterol, and PSA today - will mail you copy of results. If you do not receive them a few days before Urology apt, please call us and maybe need to pick up printed labs.  Some important numbers from today's visit: BP 142/85 mmHg  Pulse 104  Temp(Src) 98.2 F (36.8 C) (Oral)  Ht 5\' 10"  (1.778 m)  Wt 153 lb 14.4 oz (69.809 kg)  BMI 22.08 kg/m2 Results -  Follow-up with scheduling your Colonoscopy as discussed (at [facility])  Check your immunization records for TDap - due.  Please schedule a follow-up appointment with Dr. Berkley Harvey as needed within next few months to meet him and review lab results - may recommend starting Cholesterol Medicine  If you have any other questions or concerns, please feel free to call the clinic to contact me. You may also schedule an earlier appointment if necessary.  However, if your symptoms get significantly worse, please go to the Emergency Department to seek immediate medical attention.  Nobie Putnam, [place]

## 2015-06-12 NOTE — Assessment & Plan Note (Addendum)
Known h/o HLD, last lipid panel 2013. Not on statin therapy  Plan: 1. Check fasting Lipid Panel today 2. Discussed pt meets req to start mod intensity statin based on ASCVD 10 yr risk 14-18% based on BP control (calc using last lipid panel). Declined to start statin therapy at this time. Will consider and discuss with PCP in future. 3. Continues baby ASA 81mg  daily

## 2015-06-13 ENCOUNTER — Encounter: Payer: Self-pay | Admitting: Family Medicine

## 2015-06-13 LAB — PSA, TOTAL AND FREE
PSA FREE PCT: 24 % (ref >25)
PSA FREE: 1.79 ng/mL
PSA: 7.46 ng/mL — ABNORMAL HIGH (ref <=4.00)

## 2016-05-05 ENCOUNTER — Other Ambulatory Visit: Payer: Self-pay | Admitting: Family Medicine

## 2016-05-08 NOTE — Telephone Encounter (Signed)
appt made for 05-12-16. Jazmin Hartsell,CMA

## 2016-05-15 ENCOUNTER — Ambulatory Visit (INDEPENDENT_AMBULATORY_CARE_PROVIDER_SITE_OTHER): Payer: Medicare Other | Admitting: Family Medicine

## 2016-05-15 ENCOUNTER — Encounter: Payer: Self-pay | Admitting: Family Medicine

## 2016-05-15 VITALS — BP 116/60 | HR 73 | Temp 98.3°F | Wt 151.0 lb

## 2016-05-15 DIAGNOSIS — E785 Hyperlipidemia, unspecified: Secondary | ICD-10-CM

## 2016-05-15 DIAGNOSIS — R7309 Other abnormal glucose: Secondary | ICD-10-CM | POA: Diagnosis not present

## 2016-05-15 DIAGNOSIS — I10 Essential (primary) hypertension: Secondary | ICD-10-CM

## 2016-05-15 DIAGNOSIS — N401 Enlarged prostate with lower urinary tract symptoms: Secondary | ICD-10-CM

## 2016-05-15 DIAGNOSIS — Z833 Family history of diabetes mellitus: Secondary | ICD-10-CM | POA: Diagnosis not present

## 2016-05-15 DIAGNOSIS — N138 Other obstructive and reflux uropathy: Secondary | ICD-10-CM

## 2016-05-15 LAB — POCT GLYCOSYLATED HEMOGLOBIN (HGB A1C): HEMOGLOBIN A1C: 5.1

## 2016-05-15 LAB — LDL CHOLESTEROL, DIRECT: LDL DIRECT: 109 mg/dL (ref <130)

## 2016-05-15 MED ORDER — TAMSULOSIN HCL 0.4 MG PO CAPS: 0.4000 mg | ORAL_CAPSULE | Freq: Every day | ORAL | Status: DC

## 2016-05-15 MED ORDER — ATENOLOL-CHLORTHALIDONE 50-25 MG PO TABS: 0.5000 | ORAL_TABLET | Freq: Every day | ORAL | Status: DC

## 2016-05-15 NOTE — Assessment & Plan Note (Signed)
BP borderline low today with reported dizziness with standing occasionally.  Due to history of fast heart rate .  Will continue atenolol and chlorthalidone, but decrease dose to one half pill daily (atenolol 25 mg-chlorthalidone 12.5 mg) - Follow-up in 1-3 weeks for pressure recheck

## 2016-05-15 NOTE — Assessment & Plan Note (Signed)
Given history of urinary retention 2, Instruct him to continue Flomax - Advised to keep follow-up appointment with urologist in August

## 2016-05-15 NOTE — Assessment & Plan Note (Signed)
Recheck A1c 

## 2016-05-15 NOTE — Progress Notes (Signed)
  Patient name: Peter Lam MRN [id number]  Date of birth: 1946/07/20  CC & HPI:  Peter Lam is a 70 y.o. male presenting today for HTN, HLD, Urinary retention.   CHRONIC HYPERTENSION BP Readings from Last 3 Encounters:  05/15/16 116/60  06/12/15 142/85  05/02/15 123/78    Disease Monitoring  Blood pressure range outside clinc: Not checking  Chest pain: no   Dyspnea: no   Claudication: no  Medication Side Effects: Reports some lightheadedness with standing; denies falls   HLD Lab Results  Component Value Date   LDLCALC 102* 06/12/2015    Medication Compliance: Not on statin  Medication compliance: yes   Urinary retention - Reports history of urinary retention 2 - Denies current abdominal pain/pressure, dysuria - He is curious whether he can stop Flomax, says he does not notice any improvement in symptoms with this - Has appointment with urologist in one month  Smoking History Noted  Objective Findings:  Vitals: BP 116/60 mmHg  Pulse 73  Temp(Src) 98.3 F (36.8 C) (Oral)  Wt 151 lb (68.493 kg)  SpO2 100%  Gen: NAD CV: RRR w/o m/r/g, pulses +2 b/l Resp: CTAB w/ normal respiratory effort  Assessment & Plan:   HYPERTENSION, BENIGN ESSENTIAL, LABILE BP borderline low today with reported dizziness with standing occasionally.  Due to history of fast heart rate .  Will continue atenolol and chlorthalidone, but decrease dose to one half pill daily (atenolol 25 mg-chlorthalidone 12.5 mg) - Follow-up in 1-3 weeks for pressure recheck  BPH (benign prostatic hypertrophy) with urinary obstruction Given history of urinary retention 2, Instruct him to continue Flomax - Advised to keep follow-up appointment with urologist in August  Hyperlipidemia ASCVD risk 13% - Recommend starting statin; he will consider this - Recheck LDL  FH: diabetes mellitus Recheck A1c

## 2016-05-15 NOTE — Assessment & Plan Note (Signed)
ASCVD risk 13% - Recommend starting statin; he will consider this - Recheck LDL

## 2016-05-15 NOTE — Patient Instructions (Signed)
Your blood pressure is a little low today.  - Cut back to 1/2 pill of your blood pressure medication  Continue your Flomax  Consider starting Cholesterol medication and getting your colonoscopy  I have order some labs today to check your blood sugar and cholesterol. I will send you a letter with the results, or call you if we need to make any changes to your current therapies.

## 2016-05-19 ENCOUNTER — Telehealth: Payer: Self-pay | Admitting: *Deleted

## 2016-05-19 NOTE — Telephone Encounter (Signed)
Called to offer to schedule AWV. 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 Velora Heckler, RN

## 2016-06-01 ENCOUNTER — Ambulatory Visit: Payer: Medicare Other | Admitting: Family Medicine

## 2016-07-15 DIAGNOSIS — Z23 Encounter for immunization: Secondary | ICD-10-CM | POA: Diagnosis not present

## 2016-11-10 ENCOUNTER — Other Ambulatory Visit: Payer: Self-pay | Admitting: Internal Medicine

## 2016-11-10 MED ORDER — TAMSULOSIN HCL 0.4 MG PO CAPS: 0.4000 mg | ORAL_CAPSULE | Freq: Every day | ORAL | 1 refills | Status: DC

## 2016-11-10 NOTE — Telephone Encounter (Signed)
Please let Mr. Ridgell know that I have refilled his Flomax. Thank you!

## 2016-11-10 NOTE — Telephone Encounter (Signed)
Pt needs a refill on tamsulosin. Pt uses [facility] on [address]. Please advise. Thanks! ep

## 2017-03-01 ENCOUNTER — Ambulatory Visit (INDEPENDENT_AMBULATORY_CARE_PROVIDER_SITE_OTHER): Payer: Medicare Other | Admitting: *Deleted

## 2017-03-01 ENCOUNTER — Other Ambulatory Visit: Payer: Self-pay | Admitting: *Deleted

## 2017-03-01 VITALS — Ht 70.0 in | Wt 146.6 lb

## 2017-03-01 DIAGNOSIS — R3912 Poor urinary stream: Secondary | ICD-10-CM | POA: Diagnosis not present

## 2017-03-01 DIAGNOSIS — R972 Elevated prostate specific antigen [PSA]: Secondary | ICD-10-CM | POA: Diagnosis not present

## 2017-03-01 DIAGNOSIS — I998 Other disorder of circulatory system: Secondary | ICD-10-CM

## 2017-03-01 DIAGNOSIS — N401 Enlarged prostate with lower urinary tract symptoms: Secondary | ICD-10-CM | POA: Diagnosis not present

## 2017-03-01 DIAGNOSIS — I10 Essential (primary) hypertension: Secondary | ICD-10-CM

## 2017-03-01 DIAGNOSIS — Z0000 Encounter for general adult medical examination without abnormal findings: Secondary | ICD-10-CM | POA: Diagnosis not present

## 2017-03-01 DIAGNOSIS — R351 Nocturia: Secondary | ICD-10-CM | POA: Diagnosis not present

## 2017-03-01 DIAGNOSIS — Z131 Encounter for screening for diabetes mellitus: Secondary | ICD-10-CM | POA: Diagnosis not present

## 2017-03-01 DIAGNOSIS — Z125 Encounter for screening for malignant neoplasm of prostate: Secondary | ICD-10-CM | POA: Diagnosis not present

## 2017-03-01 MED ORDER — ATENOLOL-CHLORTHALIDONE 50-25 MG PO TABS: 0.5000 | ORAL_TABLET | Freq: Every day | ORAL | 0 refills | Status: DC

## 2017-03-01 NOTE — Telephone Encounter (Signed)
Will refill prescription for 60 days, but Pt needs an appointment to receive additional refills. Please call him to schedule this. Thanks!

## 2017-03-01 NOTE — Progress Notes (Signed)
   Patient in nurse clinic requesting refill on blood pressure medication.  Patient stated he was seen at his Urologist this morning and told his blood pressure was 154/104.  They told him to see his PCP restart his blood pressure medication.  Patient refused for the nurse retake his blood pressure.  Patient reported being off medication for three months.  He also declined to make an appointment with PCP. He stated he will call back for an appointment. He would like one refill until he could make an appointment.  Advised patient the nurse could not refill medication because he has been off for three months and has not been seen since June 2017.  Refill request was sent to PCP.  Derl Barrow, RN

## 2017-03-01 NOTE — Telephone Encounter (Signed)
Please call patient if medication will be refused or call his son Wille Glaser at [phone].  Derl Barrow, RN

## 2017-03-01 NOTE — Telephone Encounter (Signed)
LM for patient on his home number and also tried calling son at number listed but there was no answer.  Will try calling him again.  Vinicio Lynk,CMA

## 2017-03-02 NOTE — Telephone Encounter (Signed)
Reported to patient, he will call back to schedule.

## 2017-03-08 ENCOUNTER — Encounter: Payer: Self-pay | Admitting: Internal Medicine

## 2017-03-08 ENCOUNTER — Ambulatory Visit (INDEPENDENT_AMBULATORY_CARE_PROVIDER_SITE_OTHER): Payer: Medicare Other | Admitting: Internal Medicine

## 2017-03-08 VITALS — BP 128/82 | Ht 70.0 in | Wt 148.0 lb

## 2017-03-08 DIAGNOSIS — K5909 Other constipation: Secondary | ICD-10-CM | POA: Diagnosis not present

## 2017-03-08 DIAGNOSIS — Z1211 Encounter for screening for malignant neoplasm of colon: Secondary | ICD-10-CM

## 2017-03-08 NOTE — Patient Instructions (Signed)
  You have been scheduled for a colonoscopy. Please follow written instructions given to you at your visit today.  Please pick up your prep supplies at the pharmacy. If you use inhalers (even only as needed), please bring them with you on the day of your procedure. Your physician has requested that you go to www.startemmi.com and enter the access code given to you at your visit today. This web site gives a general overview about your procedure. However, you should still follow specific instructions given to you by our office regarding your preparation for the procedure.     I appreciate the opportunity to care for you. Carl Gessner, MD, FACG 

## 2017-03-08 NOTE — Progress Notes (Signed)
Peter Lam 71 y.o. 07-12-1946 [id number]  Assessment & Plan:   Encounter Diagnoses  Name Primary?  . Colon cancer screening Yes  . Chronic constipation    Continue his prune juice. We will schedule him for a screening colonoscopy. The risks and benefits as well as alternatives of endoscopic procedure(s) have been discussed and reviewed. All questions answered. The patient agrees to proceed. I appreciate the opportunity to care for this patient.  CC: Evette Doffing, MD   Subjective:   Chief Complaint:Colon cancer screening  HPI  The patient is a very nice 71 year old African-American man, retired Furniture conservator/restorer, with chronic constipation which she manages with prune juice. He is never had a screening colonoscopy in his sister who works with [place] had suggested he come in for one. There is no rectal bleeding. His long as he drinks his prune juice he generally moves his bowels well without difficulty. GI review of systems is otherwise negative.  Allergies  Allergen Reactions  . Ace Inhibitors     REACTION: cough  . Diltiazem Hcl     REACTION: Exacerbated reflux.   Current Meds  Medication Sig  . aspirin EC 81 MG tablet Take 1 tablet (81 mg total) by mouth daily.  Marland Kitchen atenolol-chlorthalidone (TENORETIC) 50-25 MG tablet Take 0.5 tablets by mouth daily.  . tamsulosin (FLOMAX) 0.4 MG CAPS capsule Take 1 capsule (0.4 mg total) by mouth daily. Need MD appt for more refills.   Past Medical History:  Diagnosis Date  . Arthritis   . BPH (benign prostatic hypertrophy) with urinary obstruction    follow[facility] by Dr. Jeffie Poll[facility] at [facility]  . GERD (gastroesophageal reflux disease)   . Hypertension   . Inguinal hernia    Past Surgical History:  Procedure Laterality Date  . INGUINAL HERNIA REPAIR  01/15/2012   Procedure: HERNIA REPAIR INGUINAL ADULT;  Surgeon: Earnstine Regal, MD;  Location: [facility];  Service: General;  Laterality: Left;  Left inguinal hernia with mesh   .  MULTIPLE TOOTH EXTRACTIONS    . PROSTATE BIOPSY    . TONSILLECTOMY AND ADENOIDECTOMY     Social History   Social History  . Marital status: Widowed    Spouse name: [name]  . Number of children: N/A  . Years of education: N/A   Social History Main Topics  . Smoking status: Former Smoker    Quit date: 12/07/1973  . Smokeless tobacco: Never Used  . Alcohol use No  . Drug use: No  . Sexual activity: Not Asked   Other Topics Concern  . 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   family history includes Alzheimer's disease in his mother; Cancer in his maternal uncle; Hypertension in his brother, father, and sister; Throat cancer in his other.  Review of Systems As above  Objective:   Physical Exam @BP  128/82   Ht 5\' 10"  (1.778 m)   Wt 148 lb (67.1 kg)   BMI 21.24 kg/m @  General:  Well-developed, well-nourished and in no acute distress Eyes:  anicteric. ENT:   Mouth and posterior pharynx free of lesions. + dentures Lungs: Clear to auscultation bilaterally. Heart:  S1S2, no rubs, murmurs, gallops.  Abdomen:  soft, non-tender, no hepatosplenomegaly, hernia, or mass and BS+.  Rectal: Deferred until colonoscopy Psych:  appropriate mood and  Affect.

## 2017-03-18 ENCOUNTER — Other Ambulatory Visit: Payer: Self-pay | Admitting: Urology

## 2017-03-18 DIAGNOSIS — R972 Elevated prostate specific antigen [PSA]: Secondary | ICD-10-CM

## 2017-04-05 ENCOUNTER — Ambulatory Visit (HOSPITAL_COMMUNITY)
Admission: RE | Admit: 2017-04-05 | Discharge: 2017-04-05 | Disposition: A | Discharge status: Home / Self Care | Payer: Medicare Other | Source: Ambulatory Visit | Attending: Urology | Admitting: Urology

## 2017-04-05 DIAGNOSIS — N400 Enlarged prostate without lower urinary tract symptoms: Secondary | ICD-10-CM | POA: Diagnosis not present

## 2017-04-05 DIAGNOSIS — R972 Elevated prostate specific antigen [PSA]: Secondary | ICD-10-CM

## 2017-04-05 MED ORDER — LIDOCAINE HCL 2 % EX GEL: 1.0000 "application " | Freq: Once | CUTANEOUS | Status: DC

## 2017-04-05 MED ORDER — GADOBENATE DIMEGLUMINE 529 MG/ML IV SOLN
15.0000 mL | Freq: Once | INTRAVENOUS | Status: AC | PRN
  Administered 2017-04-05: 14 mL via INTRAVENOUS

## 2017-04-05 MED ORDER — LIDOCAINE HCL 2 % EX GEL
CUTANEOUS | Status: AC
  Filled 2017-04-05: qty 30

## 2017-04-14 DIAGNOSIS — R972 Elevated prostate specific antigen [PSA]: Secondary | ICD-10-CM | POA: Diagnosis not present

## 2017-04-14 DIAGNOSIS — R351 Nocturia: Secondary | ICD-10-CM | POA: Diagnosis not present

## 2017-04-14 DIAGNOSIS — N401 Enlarged prostate with lower urinary tract symptoms: Secondary | ICD-10-CM | POA: Diagnosis not present

## 2017-04-27 ENCOUNTER — Ambulatory Visit (AMBULATORY_SURGERY_CENTER): Payer: Medicare Other | Admitting: Internal Medicine

## 2017-04-27 ENCOUNTER — Encounter: Payer: Self-pay | Admitting: Internal Medicine

## 2017-04-27 VITALS — BP 111/80 | HR 75 | Temp 96.2°F | Resp 14 | Ht 70.0 in | Wt 148.0 lb

## 2017-04-27 DIAGNOSIS — D124 Benign neoplasm of descending colon: Secondary | ICD-10-CM | POA: Diagnosis not present

## 2017-04-27 DIAGNOSIS — D122 Benign neoplasm of ascending colon: Secondary | ICD-10-CM | POA: Diagnosis not present

## 2017-04-27 DIAGNOSIS — D128 Benign neoplasm of rectum: Secondary | ICD-10-CM

## 2017-04-27 DIAGNOSIS — D120 Benign neoplasm of cecum: Secondary | ICD-10-CM | POA: Diagnosis not present

## 2017-04-27 DIAGNOSIS — Z1211 Encounter for screening for malignant neoplasm of colon: Secondary | ICD-10-CM | POA: Diagnosis not present

## 2017-04-27 DIAGNOSIS — N400 Enlarged prostate without lower urinary tract symptoms: Secondary | ICD-10-CM | POA: Diagnosis not present

## 2017-04-27 DIAGNOSIS — Z1212 Encounter for screening for malignant neoplasm of rectum: Secondary | ICD-10-CM | POA: Diagnosis not present

## 2017-04-27 DIAGNOSIS — I10 Essential (primary) hypertension: Secondary | ICD-10-CM | POA: Diagnosis not present

## 2017-04-27 DIAGNOSIS — D123 Benign neoplasm of transverse colon: Secondary | ICD-10-CM | POA: Diagnosis not present

## 2017-04-27 MED ORDER — SODIUM CHLORIDE 0.9 % IV SOLN: 500.0000 mL | INTRAVENOUS | Status: DC

## 2017-04-27 NOTE — Progress Notes (Signed)
Report to PACU, RN, vss, BBS= Clear.  

## 2017-04-27 NOTE — Progress Notes (Signed)
Pt's states no medical or surgical changes since previsit or office visit. 

## 2017-04-27 NOTE — Op Note (Signed)
[facility] Patient Name: Peter Lam Procedure Date: 04/27/2017 8:14 AM MRN: [id number] Endoscopist: Gatha Mayer , MD Age: 71 Referring MD:  Date of Birth: 07-15-1946 Gender: Male Account #: [id number] Procedure:                Colonoscopy Indications:              Screening for colorectal malignant neoplasm, This                            is the patient's first colonoscopy Medicines:                Propofol per Anesthesia, Monitored Anesthesia Care Procedure:                Pre-Anesthesia Assessment:                           - Prior to the procedure, a History and Physical                            was performed, and patient medications and                            allergies were reviewed. The patient's tolerance of                            previous anesthesia was also reviewed. The risks                            and benefits of the procedure and the sedation                            options and risks were discussed with the patient.                            All questions were answered, and informed consent                            was obtained. Prior Anticoagulants: The patient                            last took aspirin 1 day prior to the procedure. ASA                            Grade Assessment: II - A patient with mild systemic                            disease. After reviewing the risks and benefits,                            the patient was deemed in satisfactory condition to                            undergo the procedure.  After obtaining informed consent, the colonoscope                            was passed under direct vision. Throughout the                            procedure, the patient's blood pressure, pulse, and                            oxygen saturations were monitored continuously. The                            Colonoscope was introduced through the anus and                            advanced to the  the cecum, identified by                            appendiceal orifice and ileocecal valve. The                            colonoscopy was performed without difficulty. The                            patient tolerated the procedure well. The quality                            of the bowel preparation was good. The ileocecal                            valve, appendiceal orifice, and rectum were                            photographed. Scope In: 8:18:03 AM Scope Out: 8:57:38 AM Scope Withdrawal Time: 0 hours 33 minutes 22 seconds  Total Procedure Duration: 0 hours 39 minutes 35 seconds  Findings:                 The perianal examination was normal.                           The digital rectal exam findings include enlarged                            prostate. Pertinent negatives include no palpable                            rectal lesions.                           A 20 mm polyp was found in the descending colon.                            The polyp was sessile. Polypectomy was attempted,  initially using a hot snare. Polyp resection was                            incomplete with this device. This intervention then                            required a different device and polypectomy                            technique. The polyp was removed with a cold snare.                            Resection and retrieval were complete. Estimated                            blood loss was minimal. Area was tattooed with an                            injection of 3 mL of Spot (carbon black). To                            prevent bleeding after the polypectomy, three                            hemostatic clips were successfully placed (MR                            conditional).                           A 15 mm polyp was found in the ascending colon. The                            polyp was flat. The polyp was removed with a                            piecemeal technique  using a cold snare. Resection                            and retrieval were complete. Verification of                            patient identification for the specimen was done.                            Estimated blood loss was minimal. Area was tattooed                            with an injection of 3 mL of Spot (carbon black).                           A 5 mm polyp was found in the rectum. The polyp was  sessile. The polyp was removed with a cold snare.                            Resection and retrieval were complete. Verification                            of patient identification for the specimen was                            done. Estimated blood loss was minimal.                           Three sessile polyps were found in the ascending                            colon and cecum. The polyps were 1 to 2 mm in size.                            These polyps were removed with a cold biopsy                            forceps. Resection and retrieval were complete.                            Verification of patient identification for the                            specimen was done. Estimated blood loss was minimal.                           Internal hemorrhoids were found during retroflexion.                           Scattered diverticula were found in the right colon.                           The exam was otherwise without abnormality on                            direct and retroflexion views. Complications:            No immediate complications. Estimated Blood Loss:     Estimated blood loss was minimal. Impression:               - Enlarged prostate found on digital rectal exam.                           - One 20 mm polyp in the descending colon, removed                            with a cold snare. Resected and retrieved.                            Tattooed. Clips (MR conditional) were  placed.                           - One 15 mm polyp in the ascending  colon, removed                            piecemeal using a cold snare. Resected and                            retrieved. Tattooed.                           - One 5 mm polyp in the rectum, removed with a cold                            snare. Resected and retrieved.                           - Three 1 to 2 mm polyps in the ascending colon and                            in the cecum, removed with a cold biopsy forceps.                            Resected and retrieved.                           - Internal hemorrhoids.                           - Diverticulosis in the right colon.                           - The examination was otherwise normal on direct                            and retroflexion views. Recommendation:           - Patient has a contact number available for                            emergencies. The signs and symptoms of potential                            delayed complications were discussed with the                            patient. Return to normal activities tomorrow.                            Written discharge instructions were provided to the                            patient.                           -  Resume previous diet.                           - Continue present medications.                           - No aspirin, ibuprofen, naproxen, or other                            non-steroidal anti-inflammatory drugs for 2 weeks                            after polyp removal.                           - Await pathology results.                           - Repeat colonoscopy is recommended for                            surveillance. The colonoscopy date will be                            determined after pathology results from today's                            exam become available for review. Gatha Mayer, MD 04/27/2017 9:10:34 AM This report has been signed electronically.

## 2017-04-27 NOTE — Patient Instructions (Addendum)
I found and removed 6 polyps - 1 was maybe a polyp but removed. All look benign. I will let you know pathology results and when to have another routine colonoscopy by mail and/or My Chart.  I also saw internal hemorrhoids - common - not a major problem.  Do not take aspirin again until June 6  I appreciate the opportunity to care for you. Gatha Mayer, MD, FACG  YOU HAD AN ENDOSCOPIC PROCEDURE TODAY AT [facility]:   Refer to the procedure report that was given to you for any specific questions about what was found during the examination.  If the procedure report does not answer your questions, please call your gastroenterologist to clarify.  If you requested that your care partner not be given the details of your procedure findings, then the procedure report has been included in a sealed envelope for you to review at your convenience later.  YOU SHOULD EXPECT: Some feelings of bloating in the abdomen. Passage of more gas than usual.  Walking can help get rid of the air that was put into your GI tract during the procedure and reduce the bloating. If you had a lower endoscopy (such as a colonoscopy or flexible sigmoidoscopy) you may notice spotting of blood in your stool or on the toilet paper. If you underwent a bowel prep for your procedure, you may not have a normal bowel movement for a few days.  Please Note:  You might notice some irritation and congestion in your nose or some drainage.  This is from the oxygen used during your procedure.  There is no need for concern and it should clear up in a day or so.  SYMPTOMS TO REPORT IMMEDIATELY:   Following lower endoscopy (colonoscopy or flexible sigmoidoscopy):  Excessive amounts of blood in the stool  Significant tenderness or worsening of abdominal pains  Swelling of the abdomen that is new, acute  Fever of 100F or higher   Following upper endoscopy (EGD)  Vomiting of blood or coffee ground material  New  chest pain or pain under the shoulder blades  Painful or persistently difficult swallowing  New shortness of breath  Fever of 100F or higher  Black, tarry-looking stools  For urgent or emergent issues, a gastroenterologist can be reached at any hour by calling [phone].   DIET:  We do recommend a small meal at first, but then you may proceed to your regular diet.  Drink plenty of fluids but you should avoid alcoholic beverages for 24 hours.  ACTIVITY:  You should plan to take it easy for the rest of today and you should NOT DRIVE or use heavy machinery until tomorrow (because of the sedation medicines used during the test).    FOLLOW UP: Our staff will call the number listed on your records the next business day following your procedure to check on you and address any questions or concerns that you may have regarding the information given to you following your procedure. If we do not reach you, we will leave a message.  However, if you are feeling well and you are not experiencing any problems, there is no need to return our call.  We will assume that you have returned to your regular daily activities without incident.  If any biopsies were taken you will be contacted by phone or by letter within the next 1-3 weeks.  Please call us at [phone] if you have not heard about the biopsies in  3 weeks.    SIGNATURES/CONFIDENTIALITY: You and/or your care partner have signed paperwork which will be entered into your electronic medical record.  These signatures attest to the fact that that the information above on your After Visit Summary has been reviewed and is understood.  Full responsibility of the confidentiality of this discharge information lies with you and/or your care-partner.  Enlarged prostate on exam. Polyp, diverticulosis, and hemorrhoid information given.  No aspirin, ibuprofen, naproxen, or other non steroidal anti inflammatory meds for 2 weeks.  Card to identify clips  given.

## 2017-04-27 NOTE — Progress Notes (Signed)
Called to room to assist during endoscopic procedure.  Patient ID and intended procedure confirmed with present staff. Received instructions for my participation in the procedure from the performing physician.  

## 2017-04-28 ENCOUNTER — Telehealth: Payer: Self-pay | Admitting: Internal Medicine

## 2017-04-28 ENCOUNTER — Telehealth: Payer: Self-pay

## 2017-04-28 NOTE — Telephone Encounter (Signed)
I left a message for [employer]

## 2017-04-28 NOTE — Telephone Encounter (Signed)
Response and procedure report faxed to [facility] at [phone]

## 2017-04-28 NOTE — Telephone Encounter (Signed)
1) I did remove a tiny rectal polyp - cold snare - all other polyp removal sites way above this and no clips in rectum but do not think that would be a problem for the prostate bx 2) So I clear him for prostate bx w/ ultrasound probe 3) fax a copy of the colonoscopy report to Dr. Jeffie Pollock please 4) If he has any ? May call me on cell [phone]

## 2017-04-28 NOTE — Telephone Encounter (Signed)
  Follow up Call-  Call back number 04/27/2017  Post procedure Call Back phone  # [phone]  Permission to leave phone message Yes  Some recent data might be hidden     Patient questions:  Do you have a fever, pain , or abdominal swelling? No. Pain Score  0 *  Have you tolerated food without any problems? Yes.    Have you been able to return to your normal activities? Yes.    Do you have any questions about your discharge instructions: Diet   No. Medications  No. Follow up visit  No.  Do you have questions or concerns about your Care? No.  Actions: * If pain score is 4 or above: No action needed, pain <4.

## 2017-04-28 NOTE — Telephone Encounter (Signed)
Patient will have prostate biopsy next Thursday with Dr. Jeffie Pollock.  Dr. Jeffie Pollock would like to get clearance from you due to polypectomies and clips placed yesterday.    OK to proceed next week?

## 2017-05-06 DIAGNOSIS — C61 Malignant neoplasm of prostate: Secondary | ICD-10-CM | POA: Diagnosis not present

## 2017-05-06 DIAGNOSIS — R972 Elevated prostate specific antigen [PSA]: Secondary | ICD-10-CM | POA: Diagnosis not present

## 2017-05-07 ENCOUNTER — Encounter: Payer: Self-pay | Admitting: Internal Medicine

## 2017-05-07 DIAGNOSIS — Z860101 Personal history of adenomatous and serrated colon polyps: Secondary | ICD-10-CM

## 2017-05-07 DIAGNOSIS — Z86010 Personal history of colonic polyps: Secondary | ICD-10-CM

## 2017-05-07 HISTORY — DX: Personal history of adenomatous and serrated colon polyps: Z86.0101

## 2017-05-07 HISTORY — DX: Personal history of colonic polyps: Z86.010

## 2017-05-07 NOTE — Progress Notes (Signed)
6 adenomas max 20 mm some flat Colon recall September 2018 close f/u needed to ensure complete removal

## 2017-06-10 DIAGNOSIS — N5201 Erectile dysfunction due to arterial insufficiency: Secondary | ICD-10-CM | POA: Diagnosis not present

## 2017-06-10 DIAGNOSIS — C61 Malignant neoplasm of prostate: Secondary | ICD-10-CM | POA: Diagnosis not present

## 2017-06-10 DIAGNOSIS — N401 Enlarged prostate with lower urinary tract symptoms: Secondary | ICD-10-CM | POA: Diagnosis not present

## 2017-06-10 DIAGNOSIS — R3912 Poor urinary stream: Secondary | ICD-10-CM | POA: Diagnosis not present

## 2017-06-11 ENCOUNTER — Encounter: Payer: Self-pay | Admitting: Radiation Oncology

## 2017-06-17 ENCOUNTER — Ambulatory Visit: Payer: Medicare Other | Admitting: Radiation Oncology

## 2017-06-17 ENCOUNTER — Ambulatory Visit: Payer: Medicare Other

## 2017-06-22 ENCOUNTER — Encounter: Payer: Self-pay | Admitting: Radiation Oncology

## 2017-06-28 ENCOUNTER — Ambulatory Visit: Payer: Medicare Other

## 2017-06-28 ENCOUNTER — Ambulatory Visit: Payer: Medicare Other | Admitting: Radiation Oncology

## 2017-07-05 ENCOUNTER — Encounter: Payer: Self-pay | Admitting: Family

## 2017-07-05 ENCOUNTER — Ambulatory Visit (INDEPENDENT_AMBULATORY_CARE_PROVIDER_SITE_OTHER): Payer: Medicare Other | Admitting: Family

## 2017-07-05 VITALS — BP 150/98 | HR 56 | Temp 98.2°F | Resp 16 | Ht 70.0 in | Wt 152.8 lb

## 2017-07-05 DIAGNOSIS — I10 Essential (primary) hypertension: Secondary | ICD-10-CM

## 2017-07-05 DIAGNOSIS — E782 Mixed hyperlipidemia: Secondary | ICD-10-CM | POA: Diagnosis not present

## 2017-07-05 DIAGNOSIS — C61 Malignant neoplasm of prostate: Secondary | ICD-10-CM

## 2017-07-05 NOTE — Assessment & Plan Note (Signed)
Not currently maintained on medication. Continue lifestyle management with lipid profile at next office visit/annual wellness visit.

## 2017-07-05 NOTE — Assessment & Plan Note (Signed)
Blood pressure remains labile above goal 140/90 with current medication regimen and only side effect being increased urination which is complicated by prostate cancer. Continue current dosage of atenolol-chlorthalidone. Encouraged to decrease sodium intake as this is a large contributing factor. Denies worse headache of life with no symptoms of end organ damage and physical exam. Continue to monitor.

## 2017-07-05 NOTE — Patient Instructions (Addendum)
Thank you for choosing [facility].  SUMMARY AND INSTRUCTIONS:  Please continue to monitor your blood pressure at home.  Continue to take your medications as prescribed.  Schedule a time for your physical at your convenience.    DECREASE SALT / SODIUM INTAKE!!!!!  Follow up:  If your symptoms worsen or fail to improve, please contact our office for further instruction, or in case of emergency go directly to the emergency room at the closest medical facility.    Prostate Cancer The prostate is a male gland that helps make semen. Prostate cancer is when abnormal cells grow in this gland. Follow these instructions at home:  Take over-the-counter and prescription medicines only as told by your doctor.  Eat a healthy diet.  Get plenty of sleep.  Ask your doctor for help to find a support group for men with prostate cancer.  Keep all follow-up visits as told by your doctor. This is important.  If you have to go to the hospital, let your cancer doctor (oncologist) know.  Touch, hold, hug, and caress your partner to continue to show sexual feelings. Contact a doctor if:  You have trouble peeing (urinating).  You have blood in your pee (urine).  You have pain in your hips, back, or chest. Get help right away if:  You have weakness in your legs.  You lose feeling (have numbness) in your legs.  You cannot control your pee or your poop (stool).  You have trouble breathing.  You have sudden pain in your chest.  You have chills or a fever. Summary  The prostate is a male gland that helps make semen. Prostate cancer is when abnormal cells grow in this gland.  Ask your doctor for help to find a support group for men with prostate cancer.  Contact a doctor if you have problems peeing or have any new pain that you did not have before. This information is not intended to replace advice given to you by your health care provider. Make sure you discuss any questions you  have with your health care provider. Document Released: 11/11/2009 Document Revised: 08/04/2016 Document Reviewed: 08/03/2016 Elsevier Interactive Patient Education  2017 [employer] DASH stands for "Dietary Approaches to Stop Hypertension." The DASH eating plan is a healthy eating plan that has been shown to reduce high blood pressure (hypertension). It may also reduce your risk for type 2 diabetes, heart disease, and stroke. The DASH eating plan may also help with weight loss. What are tips for following this plan? General guidelines  Avoid eating more than 2,300 mg (milligrams) of salt (sodium) a day. If you have hypertension, you may need to reduce your sodium intake to 1,500 mg a day.  Limit alcohol intake to no more than 1 drink a day for nonpregnant women and 2 drinks a day for men. One drink equals 12 oz of beer, 5 oz of wine, or 1 oz of hard liquor.  Work with your health care provider to maintain a healthy body weight or to lose weight. Ask what an ideal weight is for you.  Get at least 30 minutes of exercise that causes your heart to beat faster (aerobic exercise) most days of the week. Activities may include walking, swimming, or biking.  Work with your health care provider or diet and nutrition specialist (dietitian) to adjust your eating plan to your individual calorie needs. Reading food labels  Check food labels for the amount of sodium per serving.  Choose foods with less than 5 percent of the Daily Value of sodium. Generally, foods with less than 300 mg of sodium per serving fit into this eating plan.  To find whole grains, look for the word "whole" as the first word in the ingredient list. Shopping  Buy products labeled as "low-sodium" or "no salt added."  Buy fresh foods. Avoid canned foods and premade or frozen meals. Cooking  Avoid adding salt when cooking. Use salt-free seasonings or herbs instead of table salt or sea salt. Check with your  health care provider or pharmacist before using salt substitutes.  Do not fry foods. Cook foods using healthy methods such as baking, boiling, grilling, and broiling instead.  Cook with heart-healthy oils, such as olive, canola, soybean, or sunflower oil. Meal planning   Eat a balanced diet that includes: ? 5 or more servings of fruits and vegetables each day. At each meal, try to fill half of your plate with fruits and vegetables. ? Up to 6-8 servings of whole grains each day. ? Less than 6 oz of lean meat, poultry, or fish each day. A 3-oz serving of meat is about the same size as a deck of cards. One egg equals 1 oz. ? 2 servings of low-fat dairy each day. ? A serving of nuts, seeds, or beans 5 times each week. ? Heart-healthy fats. Healthy fats called Omega-3 fatty acids are found in foods such as flaxseeds and coldwater fish, like sardines, salmon, and mackerel.  Limit how much you eat of the following: ? Canned or prepackaged foods. ? Food that is high in trans fat, such as fried foods. ? Food that is high in saturated fat, such as fatty meat. ? Sweets, desserts, sugary drinks, and other foods with added sugar. ? Full-fat dairy products.  Do not salt foods before eating.  Try to eat at least 2 vegetarian meals each week.  Eat more home-cooked food and less restaurant, buffet, and fast food.  When eating at a restaurant, ask that your food be prepared with less salt or no salt, if possible. What foods are recommended? The items listed may not be a complete list. Talk with your dietitian about what dietary choices are best for you. Grains Whole-grain or whole-wheat bread. Whole-grain or whole-wheat pasta. Brown rice. [place]. Bulgur. Whole-grain and low-sodium cereals. Pita bread. Low-fat, low-sodium crackers. Whole-wheat flour tortillas. Vegetables Fresh or frozen vegetables (raw, steamed, roasted, or grilled). Low-sodium or reduced-sodium tomato and vegetable juice.  Low-sodium or reduced-sodium tomato sauce and tomato paste. Low-sodium or reduced-sodium canned vegetables. Fruits All fresh, dried, or frozen fruit. Canned fruit in natural juice (without added sugar). Meat and other protein foods Skinless chicken or [place]. Ground chicken or [place]. Pork with fat trimmed off. Fish and seafood. Egg whites. Dried beans, peas, or lentils. Unsalted nuts, nut butters, and seeds. Unsalted canned beans. Lean cuts of beef with fat trimmed off. Low-sodium, lean deli meat. Dairy Low-fat (1%) or fat-free (skim) milk. Fat-free, low-fat, or reduced-fat cheeses. Nonfat, low-sodium ricotta or cottage cheese. Low-fat or nonfat yogurt. Low-fat, low-sodium cheese. Fats and oils Soft margarine without trans fats. Vegetable oil. Low-fat, reduced-fat, or light mayonnaise and salad dressings (reduced-sodium). Canola, safflower, olive, soybean, and sunflower oils. Avocado. Seasoning and other foods Herbs. Spices. Seasoning mixes without salt. Unsalted popcorn and pretzels. Fat-free sweets. What foods are not recommended? The items listed may not be a complete list. Talk with your dietitian about what dietary choices are best for you. Grains Baked  goods made with fat, such as croissants, muffins, or some breads. Dry pasta or rice meal packs. Vegetables Creamed or fried vegetables. Vegetables in a cheese sauce. Regular canned vegetables (not low-sodium or reduced-sodium). Regular canned tomato sauce and paste (not low-sodium or reduced-sodium). Regular tomato and vegetable juice (not low-sodium or reduced-sodium). Angie Fava. Olives. Fruits Canned fruit in a light or heavy syrup. Fried fruit. Fruit in cream or butter sauce. Meat and other protein foods Fatty cuts of meat. Ribs. Fried meat. Berniece Salines. Sausage. Bologna and other processed lunch meats. Salami. Fatback. Hotdogs. Bratwurst. Salted nuts and seeds. Canned beans with added salt. Canned or smoked fish. Whole eggs or egg yolks. Chicken  or [place] with skin. Dairy Whole or 2% milk, cream, and half-and-half. Whole or full-fat cream cheese. Whole-fat or sweetened yogurt. Full-fat cheese. Nondairy creamers. Whipped toppings. Processed cheese and cheese spreads. Fats and oils Butter. Stick margarine. Lard. Shortening. Ghee. Bacon fat. Tropical oils, such as coconut, palm kernel, or palm oil. Seasoning and other foods Salted popcorn and pretzels. Onion salt, garlic salt, seasoned salt, table salt, and sea salt. Worcestershire sauce. Tartar sauce. Barbecue sauce. Teriyaki sauce. Soy sauce, including reduced-sodium. Steak sauce. Canned and packaged gravies. Fish sauce. Oyster sauce. Cocktail sauce. Horseradish that you find on the shelf. Ketchup. Mustard. Meat flavorings and tenderizers. Bouillon cubes. Hot sauce and Tabasco sauce. Premade or packaged marinades. Premade or packaged taco seasonings. Relishes. Regular salad dressings. Where to find more information:  National Heart, Lung, and [place]: https://wilson-eaton.com/  American Heart Association: www.heart.org Summary  The DASH eating plan is a healthy eating plan that has been shown to reduce high blood pressure (hypertension). It may also reduce your risk for type 2 diabetes, heart disease, and stroke.  With the DASH eating plan, you should limit salt (sodium) intake to 2,300 mg a day. If you have hypertension, you may need to reduce your sodium intake to 1,500 mg a day.  When on the DASH eating plan, aim to eat more fresh fruits and vegetables, whole grains, lean proteins, low-fat dairy, and heart-healthy fats.  Work with your health care provider or diet and nutrition specialist (dietitian) to adjust your eating plan to your individual calorie needs. This information is not intended to replace advice given to you by your health care provider. Make sure you discuss any questions you have with your health care provider. Document Released: 11/12/2011 Document Revised:  11/16/2016 Document Reviewed: 11/16/2016 Elsevier Interactive Patient Education  2017 [employer].

## 2017-07-05 NOTE — Progress Notes (Signed)
Subjective:    Patient ID: CORRELL DENBOW, male    DOB: 1946/11/15, 71 y.o.   MRN: [id number]  Chief Complaint  Patient presents with  . Establish Care    HPI:  WOODROE VOGAN is a 71 y.o. male who  has a past medical history of Arthritis; BPH (benign prostatic hypertrophy) with urinary obstruction; Cancer [facility]); GERD (gastroesophageal reflux disease); adenomatous colonic polyps (05/07/2017); Hypertension; and Inguinal hernia. and presents today for an office visit to establish care.    1.) Hypertension - Currently maintained on atenolol-chlorthalidone and reports taking the medications as prescribed and denies adverse side effects or hypotensive readings. Blood pressures at home have been labile. Denies changes in vision, worst headache of life or new symptoms of end organ damage. Continues to consume a high salt diet. Physical activity level is good.    BP Readings from Last 3 Encounters:  07/05/17 (!) 150/98  04/27/17 111/80  03/08/17 128/82    2) Prostate Cancer - Recently diagnosed with prostate cancer and followed by Dr. Jeffie Pollock of [facility]. Continues to experience increased levels of urination. He is scheduled to follow up with radiation-oncology.     Allergies  Allergen Reactions  . Ace Inhibitors     REACTION: cough  . Diltiazem Hcl     REACTION: Exacerbated reflux.      Outpatient Medications Prior to Visit  Medication Sig Dispense Refill  . aspirin EC 81 MG tablet Take 1 tablet (81 mg total) by mouth daily. 30 tablet 11  . atenolol-chlorthalidone (TENORETIC) 50-25 MG tablet Take 0.5 tablets by mouth daily. 30 tablet 0  . tamsulosin (FLOMAX) 0.4 MG CAPS capsule Take 1 capsule (0.4 mg total) by mouth daily. Need MD appt for more refills. 32 capsule 1   No facility-administered medications prior to visit.       Past Surgical History:  Procedure Laterality Date  . INGUINAL HERNIA REPAIR  01/15/2012   Procedure: HERNIA REPAIR INGUINAL ADULT;  Surgeon: Earnstine Regal, MD;  Location: [place];  Service: General;  Laterality: Left;  Left inguinal hernia with mesh   . MULTIPLE TOOTH EXTRACTIONS    . PROSTATE BIOPSY    . TONSILLECTOMY AND ADENOIDECTOMY        Past Medical History:  Diagnosis Date  . Arthritis   . BPH (benign prostatic hypertrophy) with urinary obstruction    followed by Dr. Jeffie Pollock at [facility]  . Cancer [facility])    Prostate cancer  . GERD (gastroesophageal reflux disease)   . Hx of adenomatous colonic polyps 05/07/2017  . Hypertension   . Inguinal hernia       Review of Systems  Constitutional: Negative for chills and fever.  Eyes:       Negative for changes in vision  Respiratory: Negative for cough, chest tightness and wheezing.   Cardiovascular: Negative for chest pain, palpitations and leg swelling.  Neurological: Negative for dizziness, weakness and light-headedness.      Objective:    BP (!) 150/98 (BP Location: Left Arm, Patient Position: Sitting, Cuff Size: Normal)   Pulse (!) 56   Temp 98.2 F (36.8 C) (Oral)   Resp 16   Ht 5\' 10"  (1.778 m)   Wt 152 lb 12.8 oz (69.3 kg)   SpO2 96%   BMI 21.92 kg/m  Nursing note and vital signs reviewed.  Physical Exam  Constitutional: He is oriented to person, place, and time. He appears well-developed and well-nourished. No distress.  Cardiovascular: Normal rate, regular rhythm, normal heart sounds and intact distal pulses.   Pulmonary/Chest: Effort normal and breath sounds normal.  Neurological: He is alert and oriented to person, place, and time.  Skin: Skin is warm and dry.  Psychiatric: He has a normal mood and affect. His behavior is normal. Judgment and thought content normal.       Assessment & Plan:   Problem List Items Addressed This Visit      Cardiovascular and Mediastinum   HYPERTENSION, BENIGN ESSENTIAL, LABILE - Primary    Blood pressure remains labile above goal 140/90 with current medication regimen and only side effect  being increased urination which is complicated by prostate cancer. Continue current dosage of atenolol-chlorthalidone. Encouraged to decrease sodium intake as this is a large contributing factor. Denies worse headache of life with no symptoms of end organ damage and physical exam. Continue to monitor.        Genitourinary   Prostate cancer ([place])    Newly diagnosed prostate cancer maintained on Flomax and finasteride no adverse side effects. Currently awaiting treatment plan with visit to radiation-oncology. Continue current dosage of Flomax and finasteride with changes and follow-up per urology and radiology-oncology.        Other   Hyperlipidemia    Not currently maintained on medication. Continue lifestyle management with lipid profile at next office visit/annual wellness visit.          I am having Mr. Mamone maintain his aspirin EC, tamsulosin, atenolol-chlorthalidone, and finasteride.   Meds ordered this encounter  Medications  . finasteride (PROSCAR) 5 MG tablet    Sig: Take 5 mg by mouth daily.     Follow-up: Return in about 3 months (around 10/05/2017), or if symptoms worsen or fail to improve.  Mauricio Po, FNP

## 2017-07-05 NOTE — Assessment & Plan Note (Signed)
Newly diagnosed prostate cancer maintained on Flomax and finasteride no adverse side effects. Currently awaiting treatment plan with visit to radiation-oncology. Continue current dosage of Flomax and finasteride with changes and follow-up per urology and radiology-oncology.

## 2017-07-08 NOTE — Progress Notes (Signed)
GU Location of Tumor / Histology: prostatic adenocarcinoma  If Prostate Cancer, Gleason Score is (3 + 4) on 05/06/2017 and PSA is (9.23) on 03/01/2017  Peter Lam was seen by Dr. Jeffie Pollock @ [facility].  Patient had negative prostate biopsy and a repeat biopsy after he was found to have an elevated PSA.  He was found to have one core of low volume Gleason 6 and one core of low volume Gleason 7 was found in the ROI fusion biopsies.  He has T1c Nx Mx intermediate risk disease.  Prostate Volume is 62 gm.  The [facility] nomogram predicts 57% ECE, 41% OCD, 1% SVI and 1 % LNI.  IPSS is 31 and SHIM is 3.  He has a PF of 6 ml/sec with a PVR of 52 ml.  Biopsies of prostate revealed: 05/06/2017     Past/Anticipated interventions by urology, if any: Prostate Needle Biopsy x2  Past/Anticipated interventions by medical oncology, if any:No  Weight changes, if any: Yes  Bowel/Bladder complaints, if any: IPSS 5. Denies dysuria, hematuria, leakage or incontinence. Reports taking finasteride and flomax as directed. Had colonoscopy by Terri Piedra on 04/27/17 and now has 3 or 4 clips.   Nausea/Vomiting, if any: No  Pain issues, if any:  Chronic knee pain  SAFETY ISSUES:  Prior radiation? No  Pacemaker/ICD? no  Possible current pregnancy? no  Is the patient on methotrexate? no  Current Complaints / other details:  Mr. Peter Lam is widowed. Patient accompanied today by his son and sister.  Dr. Jeffie Pollock wanted patient to be seen for consideration of seed implant.  Lives in [place]. Retired Licensed conveyancer (made wrapping paper).

## 2017-07-12 ENCOUNTER — Ambulatory Visit
Admission: RE | Admit: 2017-07-12 | Discharge: 2017-07-12 | Disposition: A | Discharge status: Home / Self Care | Payer: Medicare Other | Source: Ambulatory Visit | Attending: Radiation Oncology | Admitting: Radiation Oncology

## 2017-07-12 ENCOUNTER — Encounter: Payer: Self-pay | Admitting: Radiation Oncology

## 2017-07-12 ENCOUNTER — Encounter: Payer: Self-pay | Admitting: Medical Oncology

## 2017-07-12 ENCOUNTER — Encounter: Payer: Self-pay | Admitting: *Deleted

## 2017-07-12 VITALS — BP 148/92 | HR 74 | Temp 97.7°F | Resp 20 | Wt 150.0 lb

## 2017-07-12 DIAGNOSIS — Z87891 Personal history of nicotine dependence: Secondary | ICD-10-CM | POA: Insufficient documentation

## 2017-07-12 DIAGNOSIS — I10 Essential (primary) hypertension: Secondary | ICD-10-CM | POA: Insufficient documentation

## 2017-07-12 DIAGNOSIS — Z748 Other problems related to care provider dependency: Secondary | ICD-10-CM

## 2017-07-12 DIAGNOSIS — Z79899 Other long term (current) drug therapy: Secondary | ICD-10-CM | POA: Insufficient documentation

## 2017-07-12 DIAGNOSIS — Z7982 Long term (current) use of aspirin: Secondary | ICD-10-CM | POA: Insufficient documentation

## 2017-07-12 DIAGNOSIS — Z808 Family history of malignant neoplasm of other organs or systems: Secondary | ICD-10-CM | POA: Insufficient documentation

## 2017-07-12 DIAGNOSIS — Z87438 Personal history of other diseases of male genital organs: Secondary | ICD-10-CM | POA: Diagnosis not present

## 2017-07-12 DIAGNOSIS — C61 Malignant neoplasm of prostate: Secondary | ICD-10-CM

## 2017-07-12 DIAGNOSIS — Z8042 Family history of malignant neoplasm of prostate: Secondary | ICD-10-CM | POA: Diagnosis not present

## 2017-07-12 DIAGNOSIS — R972 Elevated prostate specific antigen [PSA]: Secondary | ICD-10-CM | POA: Diagnosis not present

## 2017-07-12 DIAGNOSIS — Z8249 Family history of ischemic heart disease and other diseases of the circulatory system: Secondary | ICD-10-CM | POA: Insufficient documentation

## 2017-07-12 HISTORY — DX: Malignant neoplasm of prostate: C61

## 2017-07-12 NOTE — Progress Notes (Signed)
Radiation Oncology         ([phone]) [phone] ________________________________  Initial outpatient Consultation  Name: Peter Lam MRN: [id number]  Date: 07/12/2017  DOB: 1946/01/20  LD:[id number], Ples Specter, FNP  Irine Seal, MD   REFERRING PHYSICIAN: Irine Seal, MD  DIAGNOSIS: The encounter diagnosis was Malignant neoplasm of prostate [facility]).    ICD-10-CM   1. Malignant neoplasm of prostate ([place]) C61     HISTORY OF PRESENT ILLNESS: Peter Lam is a 71 y.o. male with a diagnosis of prostate cancer. He had an elevated PSA that was 6.88 in 2010 ( up from 4.97 in 2009, and 4.72 in 2008), and underwent a biopsy at that time that was negative. He  was noted to have a continued upward trend of PSA to 9.23, up from 5.72 in 2015), and an MRI was performed of the prostate on 04/05/17. The prostate was about 47 cc and non masslike T2 hypointensity was noted in the left mid to apical gland. This corresponded to probable early post contrast enhancement. He underwent MRI fusion biopsy on 05/06/17 which revealed adenocarcinoma, Gleason's Score of 3+3 in the right lateral apex. The region of interest on MRI was also sampled in 3 specimens and revealed a small focus of adenocarcinoma with a Gleason's Score of 3+4.  He has not had palpable nodules in the prostate, however he has been seen for BPH which has led to urinary outflow obstruction. He has been on finasteride and flomax for about a month and noticed improvement in his urinary scores. His prior IPSS score was 31. The patient reviewed the biopsy results with his urologist and he has kindly been referred today for discussion of potential radiation treatment options. He is not interested in prostatectomy due to the recovery time. He comes today to discuss options of treatment.    PREVIOUS RADIATION THERAPY: No  PAST MEDICAL HISTORY:  Past Medical History:  Diagnosis Date  . Arthritis   . BPH (benign prostatic hypertrophy) with urinary obstruction    followed by Dr. Jeffie Pollock at [facility]  . Cancer [facility])    Prostate cancer  . GERD (gastroesophageal reflux disease)   . Hx of adenomatous colonic polyps 05/07/2017  . Hypertension   . Inguinal hernia   . Prostate cancer ([place])       PAST SURGICAL HISTORY: Past Surgical History:  Procedure Laterality Date  . INGUINAL HERNIA REPAIR  01/15/2012   Procedure: HERNIA REPAIR INGUINAL ADULT;  Surgeon: Earnstine Regal, MD;  Location: [place];  Service: General;  Laterality: Left;  Left inguinal hernia with mesh   . MULTIPLE TOOTH EXTRACTIONS    . PROSTATE BIOPSY    . TONSILLECTOMY AND ADENOIDECTOMY      FAMILY HISTORY:  Family History  Problem Relation Age of Onset  . Alzheimer's disease Mother   . Hypertension Father   . Hypertension Brother   . Cancer Maternal Uncle        colon  . Colon cancer Maternal Uncle   . Hypertension Sister   . Throat cancer Other   . Cancer Maternal Uncle        throat  . Cancer Maternal Uncle        prostate    SOCIAL HISTORY:  Social History   Social History  . Marital status: Widowed    Spouse name: Peter Lam  . Number of children: 1  . Years of education: 7   Occupational History  . Retired  Social History Main Topics  . Smoking status: Former Smoker    Packs/day: 1.00    Years: 20.00    Types: Cigarettes    Quit date: 12/07/1973  . Smokeless tobacco: Never Used  . Alcohol use No  . Drug use: No  . Sexual activity: Not Currently   Other Topics Concern  . 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   Fun/Hobby: Spends a lot of time at church (cho[employer]), reading     ALLERGIES: Ace inhibitors and Diltiazem hcl  MEDICATIONS:  Current Outpatient Prescriptions  Medication Sig Dispense Refill  . atenolol-chlorthalidone (TENORETIC) 50-25 MG tablet Take 0.5 tablets by mouth daily. 30 tablet 0  . finasteride  (PROSCAR) 5 MG tablet Take 5 mg by mouth daily.    . tamsulosin (FLOMAX) 0.4 MG CAPS capsule Take 1 capsule (0.4 mg total) by mouth daily. Need MD appt for more refills. 32 capsule 1  . aspirin EC 81 MG tablet Take 1 tablet (81 mg total) by mouth daily. (Patient not taking: Reported on 07/12/2017) 30 tablet 11   No current facility-administered medications for this encounter.     REVIEW OF SYSTEMS:  On review of systems, the patient reports that he is doing well overall. He denies any chest pain, shortness of breath, cough, fevers, chills, night sweats, unintended weight changes. He denies any bowel disturbances, and denies abdominal pain, nausea or vomiting. He denies any new musculoskeletal or joint aches or pains. His IPSS was 5, indicating mild urinary symptoms, as above however his prior IPSS before finasteride and flomax was 31. He is not sexually active currently but is able to achieve erections. A complete review of systems is obtained and is otherwise negative.    PHYSICAL EXAM:  Wt Readings from Last 3 Encounters:  07/12/17 150 lb (68 kg)  07/05/17 152 lb 12.8 oz (69.3 kg)  04/27/17 148 lb (67.1 kg)   Temp Readings from Last 3 Encounters:  07/12/17 97.7 F (36.5 C) (Oral)  07/05/17 98.2 F (36.8 C) (Oral)  04/27/17 (!) 96.2 F (35.7 C)   BP Readings from Last 3 Encounters:  07/12/17 (!) 148/92  07/05/17 (!) 150/98  04/27/17 111/80   Pulse Readings from Last 3 Encounters:  07/12/17 74  07/05/17 (!) 56  04/27/17 75    In general this is a well appearing African American male in no acute distress. He is alert and oriented x4 and appropriate throughout the examination. HEENT reveals that the patient is normocephalic, atraumatic. EOMs are intact. PERRLA. Skin is intact without any evidence of gross lesions. Cardiovascular exam reveals a regular rate and rhythm, no clicks rubs or murmurs are auscultated. Chest is clear to auscultation bilaterally. Lymphatic assessment is  performed and does not reveal any adenopathy in the cervical, supraclavicular, axillary, or inguinal chains. Abdomen has active bowel sounds in all quadrants and is intact. The abdomen is soft, non tender, non distended. Lower extremities are negative for pretibial pitting edema, deep calf tenderness, cyanosis or clubbing.   KPS = 90 100 - Normal; no complaints; no evidence of disease. 90   - Able to carry on normal activity; minor signs or symptoms of disease. 80   - Normal activity with effort; some signs or symptoms of disease. 42   - Cares for self; unable to carry on normal activity or to do  active work. 60   - Requires occasional assistance, but is able to care for most of his personal needs. 50   - Requires considerable assistance and frequent medical care. 31   - Disabled; requires special care and assistance. 79   - Severely disabled; hospital admission is indicated although death not imminent. 49   - Very sick; hospital admission necessary; active supportive treatment necessary. 10   - Moribund; fatal processes progressing rapidly. 0     - Dead  Karnofsky DA, Abelmann [place], Craver LS and Burchenal [facility] [phone]) The use of the nitrogen mustards in the palliative treatment of carcinoma: with particular reference to bronchogenic carcinoma Cancer 1 [id number]  LABORATORY DATA:  Lab Results  Component Value Date   WBC 10.3 05/29/2010   HGB 16.7 05/08/2012   HCT 49.0 05/08/2012   MCV 96.4 05/29/2010   PLT 155 05/29/2010   Lab Results  Component Value Date   NA 142 06/12/2015   K 3.2 (L) 06/12/2015   CL 102 06/12/2015   CO2 29 06/12/2015   Lab Results  Component Value Date   ALT 26 12/04/2011   AST 23 12/04/2011   ALKPHOS 71 12/04/2011   BILITOT 0.6 12/04/2011     RADIOGRAPHY: No results found.    IMPRESSION/PLAN: 1. 71 y.o. gentleman with intermediate risk, Stage T1c adenocarcinoma of the prostate with Gleason Score of 3+4, and PSA of 9.23. We reviewed the patient's clinical  scenario and history as well as his recent imaging and biopsies. We discussed the findings and reviewed how treatment decision making is based on T staging, Gleason Score, PSA, and IPSS scoring. We reviewed options of prostatectomy which he is not interested in due to caring for himself postoperatively. He is counseled on the options of raditoherapy externally or as brachytherapy. After reviewing his history of BPH and outflow obstruction, he is not interested in the risks of outflow obstruction with brachytherapy. Rather he is interested in external beam radiotherapy to the prostate with IMRT.  We discussed the risks, benefits, short, and long term effects of radiotherapy, and the patient is interested in proceeding. We reviewed the delivery and logistics of 8 weeks of treatment. We will coordinate for him to return to Dr. Jeffie Pollock for fiducial marker placement, and plan to bring the patient for simulation a few days following this. The patient is in agreement and interested in moving forward.  2. Concerns with transportation. We have reached out to social work for their assistance in this.      Carola Rhine, PAC Seen with  _____________________________________  Sheral Apley Tammi Klippel, M.D.

## 2017-07-12 NOTE — Progress Notes (Signed)
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  CSW contacted patient at home to offer support and assess for needs.  Patient stated he had family support, but need assistance with transportation to daily radiation.  CSW and patient discussed transportation resources and patient was agreeable to completing a SCAT application.  CSW mailed patient a SCAT application.  Patient will contact CSW once he receives the application and completes the patient information.  Once patient completes and returns application, CSW will complete medical information and submit.  CSW encouraged patient to call with any questions or concerns.    Johnnye Lana, MSW, LCSW, OSW-C Clinical Social Worker [facility] [phone]

## 2017-07-12 NOTE — Progress Notes (Signed)
See progress note under physician encounter. 

## 2017-07-13 ENCOUNTER — Encounter: Payer: Self-pay | Admitting: Radiation Oncology

## 2017-07-13 NOTE — Progress Notes (Signed)
Mr. Casasola here with son and daughter to consult with Dr. Tammi Klippel regarding treatment options for prostate cancer. He is not interested in surgery but his radiation options. He stressed his main concern is transportation. He does not drive. His daughter works across the street at [employer] and can assist in getting him home late in the day.  A referral has been placed with social work to assist with this issue. I will continue to follow and asked them to call with questions or concerns.

## 2017-07-15 ENCOUNTER — Telehealth: Payer: Self-pay | Admitting: Radiation Oncology

## 2017-07-15 NOTE — Telephone Encounter (Signed)
I spoke with the patient and let him know that I had reached out to Dr. Ralene Muskrat nurse, Loree Fee. We will await Dr. Ralene Muskrat recommendations. The patient may hold off on radiation treatment. He remains on finasteride and flomax and reports he's doing well and no increase in urinary symptoms since we saw him Monday. He has an IPSS score of 5 indicating mild symptoms. I will touch base with him in a few weeks as well to confirm plans for him moving forward either with [place] or with urology.

## 2017-08-03 DIAGNOSIS — C61 Malignant neoplasm of prostate: Secondary | ICD-10-CM | POA: Diagnosis not present

## 2017-08-13 ENCOUNTER — Other Ambulatory Visit: Payer: Self-pay | Admitting: Urology

## 2017-08-16 DIAGNOSIS — C61 Malignant neoplasm of prostate: Secondary | ICD-10-CM | POA: Diagnosis not present

## 2017-08-16 DIAGNOSIS — R278 Other lack of coordination: Secondary | ICD-10-CM | POA: Diagnosis not present

## 2017-08-16 DIAGNOSIS — M6281 Muscle weakness (generalized): Secondary | ICD-10-CM | POA: Diagnosis not present

## 2017-08-27 NOTE — Patient Instructions (Addendum)
Peter Lam  08/27/2017   Your procedure is scheduled on: 09-06-17   Report to [facility] Main  Entrance Take [name]  Elevators to 3rd floor to  Peter Lam at 9:30 AM.   Call this number if you have problems the morning of surgery [phone]    Remember: ONLY 1 PERSON MAY GO WITH YOU TO SHORT STAY TO GET  READY MORNING OF [place].  Do not eat food or drink liquids :After Midnight.     Take these medicines the morning of surgery with A SIP OF WATER:  None                                You may not have any metal on your body including hair pins and              piercings  Do not wear jewelry, make-up, lotions, powders or perfumes, deodorant             Men may shave face and neck.   Do not bring valuables to the hospital. Peter Lam.  Contacts, dentures or bridgework may not be worn into surgery.  Leave suitcase in the car. After surgery it may be brought to your room.                  Please read over the following fact sheets you were given: _____________________________________________________________________             [name] - Preparing for Surgery Before surgery, you can play an important role.  Because skin is not sterile, your skin needs to be as free of germs as possible.  You can reduce the number of germs on your skin by washing with CHG (chlorahexidine gluconate) soap before surgery.  CHG is an antiseptic cleaner which kills germs and bonds with the skin to continue killing germs even after washing. Please DO NOT use if you have an allergy to CHG or antibacterial soaps.  If your skin becomes reddened/irritated stop using the CHG and inform your nurse when you arrive at Short Stay. Do not shave (including legs and underarms) for at least 48 hours prior to the first CHG shower.  You may shave your face/neck. Please follow these instructions carefully:  1.  Shower with CHG Soap  the night before surgery and the  morning of Surgery.  2.  If you choose to wash your hair, wash your hair first as usual with your  normal  shampoo.  3.  After you shampoo, rinse your hair and body thoroughly to remove the  shampoo.                           4.  Use CHG as you would any other liquid soap.  You can apply chg directly  to the skin and wash                       Gently with a scrungie or clean washcloth.  5.  Apply the CHG Soap to your body ONLY FROM THE NECK DOWN.   Do not use on face/ open  Wound or open sores. Avoid contact with eyes, ears mouth and genitals (private parts).                       Wash face,  Genitals (private parts) with your normal soap.             6.  Wash thoroughly, paying special attention to the area where your surgery  will be performed.  7.  Thoroughly rinse your body with warm water from the neck down.  8.  DO NOT shower/wash with your normal soap after using and rinsing off  the CHG Soap.                9.  Pat yourself dry with a clean towel.            10.  Wear clean pajamas.            11.  Place clean sheets on your bed the night of your first shower and do not  sleep with pets. Day of Surgery : Do not apply any lotions/deodorants the morning of surgery.  Please wear clean clothes to the hospital/surgery center.  FAILURE TO FOLLOW THESE INSTRUCTIONS MAY RESULT IN THE CANCELLATION OF YOUR SURGERY PATIENT SIGNATURE_________________________________  NURSE SIGNATURE__________________________________  ________________________________________________________________________   Peter Lam  An incentive spirometer is a tool that can help keep your lungs clear and active. This tool measures how well you are filling your lungs with each breath. Taking long deep breaths may help reverse or decrease the chance of developing breathing (pulmonary) problems (especially infection) following:  A long period of time when  you are unable to move or be active. BEFORE THE PROCEDURE   If the spirometer includes an indicator to show your best effort, your nurse or respiratory therapist will set it to a desired goal.  If possible, sit up straight or lean slightly forward. Try not to slouch.  Hold the incentive spirometer in an upright position. INSTRUCTIONS FOR USE  1. Sit on the edge of your bed if possible, or sit up as far as you can in bed or on a chair. 2. Hold the incentive spirometer in an upright position. 3. Breathe out normally. 4. Place the mouthpiece in your mouth and seal your lips tightly around it. 5. Breathe in slowly and as deeply as possible, raising the piston or the ball toward the top of the column. 6. Hold your breath for 3-5 seconds or for as long as possible. Allow the piston or ball to fall to the bottom of the column. 7. Remove the mouthpiece from your mouth and breathe out normally. 8. Rest for a few seconds and repeat Steps 1 through 7 at least 10 times every 1-2 hours when you are awake. Take your time and take a few normal breaths between deep breaths. 9. The spirometer may include an indicator to show your best effort. Use the indicator as a goal to work toward during each repetition. 10. After each set of 10 deep breaths, practice coughing to be sure your lungs are clear. If you have an incision (the cut made at the time of surgery), support your incision when coughing by placing a pillow or rolled up towels firmly against it. Once you are able to get out of bed, walk around indoors and cough well. You may stop using the incentive spirometer when instructed by your caregiver.  RISKS AND COMPLICATIONS  Take your time so you do not get  dizzy or light-headed.  If you are in pain, you may need to take or ask for pain medication before doing incentive spirometry. It is harder to take a deep breath if you are having pain. AFTER USE  Rest and breathe slowly and easily.  It can be  helpful to keep track of a log of your progress. Your caregiver can provide you with a simple table to help with this. If you are using the spirometer at home, follow these instructions: [place] IF:   You are having difficultly using the spirometer.  You have trouble using the spirometer as often as instructed.  Your pain medication is not giving enough relief while using the spirometer.  You develop fever of 100.5 F (38.1 C) or higher. SEEK IMMEDIATE MEDICAL CARE IF:   You cough up bloody sputum that had not been present before.  You develop fever of 102 F (38.9 C) or greater.  You develop worsening pain at or near the incision site. MAKE SURE YOU:   Understand these instructions.  Will watch your condition.  Will get help right away if you are not doing well or get worse. Document Released: 04/05/2007 Document Revised: 02/15/2012 Document Reviewed: 06/06/2007 ExitCare Patient Information 2014 ExitCare, [place].   ________________________________________________________________________  WHAT IS A BLOOD TRANSFUSION? Blood Transfusion Information  A transfusion is the replacement of blood or some of its parts. Blood is made up of multiple cells which provide different functions.  Red blood cells carry oxygen and are used for blood loss replacement.  White blood cells fight against infection.  Platelets control bleeding.  Plasma helps clot blood.  Other blood products are available for specialized needs, such as hemophilia or other clotting disorders. BEFORE THE TRANSFUSION  Who gives blood for transfusions?   Healthy volunteers who are fully evaluated to make sure their blood is safe. This is blood bank blood. Transfusion therapy is the safest it has ever been in the practice of medicine. Before blood is taken from a donor, a complete history is taken to make sure that person has no history of diseases nor engages in risky social behavior (examples are  intravenous drug use or sexual activity with multiple partners). The donor's travel history is screened to minimize risk of transmitting infections, such as malaria. The donated blood is tested for signs of infectious diseases, such as HIV and hepatitis. The blood is then tested to be sure it is compatible with you in order to minimize the chance of a transfusion reaction. If you or a relative donates blood, this is often done in anticipation of surgery and is not appropriate for emergency situations. It takes many days to process the donated blood. RISKS AND COMPLICATIONS Although transfusion therapy is very safe and saves many lives, the main dangers of transfusion include:   Getting an infectious disease.  Developing a transfusion reaction. This is an allergic reaction to something in the blood you were given. Every precaution is taken to prevent this. The decision to have a blood transfusion has been considered carefully by your caregiver before blood is given. Blood is not given unless the benefits outweigh the risks. AFTER THE TRANSFUSION  Right after receiving a blood transfusion, you will usually feel much better and more energetic. This is especially true if your red blood cells have gotten low (anemic). The transfusion raises the level of the red blood cells which carry oxygen, and this usually causes an energy increase.  The nurse administering the transfusion will  monitor you carefully for complications. HOME CARE INSTRUCTIONS  No special instructions are needed after a transfusion. You may find your energy is better. Speak with your caregiver about any limitations on activity for underlying diseases you may have. SEEK MEDICAL CARE IF:   Your condition is not improving after your transfusion.  You develop redness or irritation at the intravenous (IV) site. SEEK IMMEDIATE MEDICAL CARE IF:  Any of the following symptoms occur over the next 12 hours:  Shaking chills.  You have a  temperature by mouth above 102 F (38.9 C), not controlled by medicine.  Chest, back, or muscle pain.  People around you feel you are not acting correctly or are confused.  Shortness of breath or difficulty breathing.  Dizziness and fainting.  You get a rash or develop hives.  You have a decrease in urine output.  Your urine turns a dark color or changes to pink, red, or brown. Any of the following symptoms occur over the next 10 days:  You have a temperature by mouth above 102 F (38.9 C), not controlled by medicine.  Shortness of breath.  Weakness after normal activity.  The white part of the eye turns yellow (jaundice).  You have a decrease in the amount of urine or are urinating less often.  Your urine turns a dark color or changes to pink, red, or brown. Document Released: 11/20/2000 Document Revised: 02/15/2012 Document Reviewed: 07/09/2008 [facility] Patient Information 2014 [place], [place].  _______________________________________________________________________

## 2017-08-29 ENCOUNTER — Emergency Department (HOSPITAL_COMMUNITY)
Admission: EM | Admit: 2017-08-29 | Discharge: 2017-08-29 | Disposition: A | Discharge status: Home / Self Care | Payer: Medicare Other | Attending: Emergency Medicine | Admitting: Emergency Medicine

## 2017-08-29 ENCOUNTER — Encounter (HOSPITAL_COMMUNITY): Payer: Self-pay | Admitting: Emergency Medicine

## 2017-08-29 DIAGNOSIS — Z0130 Encounter for examination of blood pressure without abnormal findings: Secondary | ICD-10-CM | POA: Diagnosis present

## 2017-08-29 DIAGNOSIS — Z87891 Personal history of nicotine dependence: Secondary | ICD-10-CM | POA: Insufficient documentation

## 2017-08-29 DIAGNOSIS — I10 Essential (primary) hypertension: Secondary | ICD-10-CM

## 2017-08-29 DIAGNOSIS — Z79899 Other long term (current) drug therapy: Secondary | ICD-10-CM | POA: Insufficient documentation

## 2017-08-29 DIAGNOSIS — Z8546 Personal history of malignant neoplasm of prostate: Secondary | ICD-10-CM | POA: Diagnosis not present

## 2017-08-29 NOTE — ED Triage Notes (Signed)
Pt reports his BP was 539 systolic this am. Hx of HTN. Took home BP medication, and BP decreased prior to arrival. Pt just wants to be seen to make sure he is okay for surgery in couple weeks.

## 2017-08-29 NOTE — Discharge Instructions (Signed)
Your blood pressure returned to normal while here in the ED. Continue your medication as directed and keep your appointment for Wednesday. Return here as needed for any problems.

## 2017-08-29 NOTE — ED Provider Notes (Signed)
[place] DEPT Provider Note   CSN: [id number] Arrival date & time: 08/29/17  3762     History   Chief Complaint Chief Complaint  Patient presents with  . Hypertension    HPI Peter Lam is a 71 y.o. male who presents to the ED with elevated BP. Patient reports that he took his BP this morning and the systolic reading was 831. He took his BP medication before coming to the ED and now BP is 142/96. Patient states that he just wants to be seen to make sure he is ok for surgery in a couple weeks. Patient reports that he has prostate cancer. He has a pre op appointment this week. Patient denies headache, shortness of breath, chest pain, n/v or any other problems.   HPI  Past Medical History:  Diagnosis Date  . Arthritis   . BPH (benign prostatic hypertrophy) with urinary obstruction    followed by Dr. Jeffie Pollock at [facility]  . Cancer [facility])    Prostate cancer  . GERD (gastroesophageal reflux disease)   . Hx of adenomatous colonic polyps 05/07/2017  . Hypertension   . Inguinal hernia   . Prostate cancer [facility])     Patient Active Problem List   Diagnosis Date Noted  . Prostate cancer ([place]) 07/05/2017  . Hx of adenomatous colonic polyps 05/07/2017  . FH: diabetes mellitus 05/25/2014  . Health maintenance examination 03/16/2013  . Allergic dermatitis 07/18/2012  . PSA, INCREASED 02/27/2009  . Hyperlipidemia 08/30/2008  . HYPERTENSION, BENIGN ESSENTIAL, LABILE 08/12/2007  . BPH (benign prostatic hypertrophy) with urinary obstruction 08/12/2007    Past Surgical History:  Procedure Laterality Date  . INGUINAL HERNIA REPAIR  01/15/2012   Procedure: HERNIA REPAIR INGUINAL ADULT;  Surgeon: Earnstine Regal, MD;  Location: [facility];  Service: General;  Laterality: Left;  Left inguinal hernia with mesh   . MULTIPLE TOOTH EXTRACTIONS    . PROSTATE BIOPSY    . TONSILLECTOMY AND ADENOIDECTOMY         Home Medications    Prior to Admission medications     Medication Sig Start Date End Date Taking? Authorizing Provider  aspirin EC 81 MG tablet Take 1 tablet (81 mg total) by mouth daily. Patient not taking: Reported on 07/12/2017 05/25/14   Hilton Sinclair, MD  atenolol-chlorthalidone (TENORETIC) 50-25 MG tablet Take 0.5 tablets by mouth daily. 03/01/17   Mayo, Pete Pelt, MD  finasteride (PROSCAR) 5 MG tablet Take 5 mg by mouth daily.    [provider]  tamsulosin (FLOMAX) 0.4 MG CAPS capsule Take 1 capsule (0.4 mg total) by mouth daily. Need MD appt for more refills. 11/10/16   Mayo, Pete Pelt, MD    Family History Family History  Problem Relation Age of Onset  . Alzheimer's disease Mother   . Hypertension Father   . Hypertension Brother   . Cancer Maternal Uncle        colon  . Colon cancer Maternal Uncle   . Hypertension Sister   . Throat cancer Other   . Cancer Maternal Uncle        throat  . Cancer Maternal Uncle        prostate    Social History Social History  Substance Use Topics  . Smoking status: Former Smoker    Packs/day: 1.00    Years: 20.00    Types: Cigarettes    Quit date: 12/07/1973  . Smokeless tobacco: Never Used  . Alcohol use No  Allergies   Ace inhibitors and Diltiazem hcl   Review of Systems Review of Systems  Constitutional: Negative for chills and fever.  HENT: Negative.   Respiratory: Negative for shortness of breath.   Cardiovascular: Negative for chest pain.  Gastrointestinal: Negative for nausea and vomiting.  Neurological: Negative for dizziness and headaches.     Physical Exam Updated Vital Signs BP 128/84 Comment: took medications at 730am  Pulse (!) 53   Temp 98.1 F (36.7 C) (Oral)   Resp 12   Ht 5\' 10"  (1.778 m)   Wt 66.7 kg (147 lb 1 oz)   SpO2 100%   BMI 21.10 kg/m   Physical Exam  Constitutional: He appears well-developed and well-nourished. No distress.  HENT:  Head: Normocephalic and atraumatic.  Eyes: EOM are normal.  Neck: Neck supple.   Cardiovascular: Normal rate and regular rhythm.   Pulmonary/Chest: Effort normal. He has no wheezes. He has no rales.  Musculoskeletal: Normal range of motion. He exhibits no edema.  Neurological: He is alert.  Skin: Skin is warm and dry.  Psychiatric: He has a normal mood and affect.  Nursing note and vitals reviewed.    ED Treatments / Results  Labs (all labs ordered are listed, but only abnormal results are displayed) Labs Reviewed - No data to display   Radiology No results found.  Procedures Procedures (including critical care time)  Medications Ordered in ED Medications - No data to display 71 y.o. male with elevated BP reading at home this morning before taking his BP medication stable for d/c without any symptoms and BP returning to normal while in the ED. Patient to continue his medication as directed. Return precautions.   Initial Impression / Assessment and Plan / ED Course  I have reviewed the triage vital signs and the nursing notes.  Final Clinical Impressions(s) / ED Diagnoses   Final diagnoses:  Essential hypertension    New Prescriptions New Prescriptions   No medications on file     Ashley Murrain, NP 08/29/17 [place]    Orlie Dakin, MD 08/29/17 [phone]

## 2017-08-29 NOTE — ED Provider Notes (Signed)
Patient checked his blood pressure at home this morning with an automatic cuff was noted to be 143 systolic.he checked his blood pressure before taking his antihypertensive medication this morning He was asymptomatic at the time and remains asymptomatic. He comes to get his blood pressure recheck.ed. Patient is alert Glasgow Coma Score 15. No distress   Peter Dakin, MD 08/29/17 1159

## 2017-08-31 ENCOUNTER — Telehealth: Payer: Self-pay | Admitting: Radiation Oncology

## 2017-08-31 NOTE — Telephone Encounter (Signed)
I called the patient to check and see if he had made a decision for surgery or XRT and he is scheduled for prostatectomy next week. I encouraged him to call if he has questions or concerns moving forward.

## 2017-09-01 ENCOUNTER — Encounter (HOSPITAL_COMMUNITY)
Admission: RE | Admit: 2017-09-01 | Discharge: 2017-09-01 | Disposition: A | Discharge status: Home / Self Care | Payer: Medicare Other | Source: Ambulatory Visit | Attending: Urology | Admitting: Urology

## 2017-09-01 ENCOUNTER — Encounter (HOSPITAL_COMMUNITY): Payer: Self-pay

## 2017-09-01 DIAGNOSIS — I10 Essential (primary) hypertension: Secondary | ICD-10-CM | POA: Diagnosis not present

## 2017-09-01 DIAGNOSIS — Z01812 Encounter for preprocedural laboratory examination: Secondary | ICD-10-CM | POA: Insufficient documentation

## 2017-09-01 DIAGNOSIS — Z01810 Encounter for preprocedural cardiovascular examination: Secondary | ICD-10-CM | POA: Insufficient documentation

## 2017-09-01 LAB — CBC
HEMATOCRIT: 48.1 % (ref 39.0–52.0)
Hemoglobin: 17.3 g/dL — ABNORMAL HIGH (ref 13.0–17.0)
MCH: 31.9 pg (ref 26.0–34.0)
MCHC: 36 g/dL (ref 30.0–36.0)
MCV: 88.6 fL (ref 78.0–100.0)
PLATELETS: 199 10*3/uL (ref 150–400)
RBC: 5.43 MIL/uL (ref 4.22–5.81)
RDW: 13.6 % (ref 11.5–15.5)
WBC: 7.6 10*3/uL (ref 4.0–10.5)

## 2017-09-01 LAB — BASIC METABOLIC PANEL
ANION GAP: 11 (ref 5–15)
BUN: 6 mg/dL (ref 6–20)
CALCIUM: 9.3 mg/dL (ref 8.9–10.3)
CO2: 29 mmol/L (ref 22–32)
Chloride: 97 mmol/L — ABNORMAL LOW (ref 101–111)
Creatinine, Ser: 0.89 mg/dL (ref 0.61–1.24)
GFR calc Af Amer: >60 mL/min (ref >60)
GLUCOSE: 96 mg/dL (ref 65–99)
POTASSIUM: 3 mmol/L — AB (ref 3.5–5.1)
SODIUM: 137 mmol/L (ref 135–145)

## 2017-09-01 NOTE — Progress Notes (Signed)
09-01-17 BMP result routed to Dr. Alinda Money for review.

## 2017-09-03 NOTE — H&P (Signed)
CC/HPI: CC: Prostate Cancer    Mr. Peter Lam is a 71 year old who has a history of a prior negative prostate biopsy. Due to a persistent further rise in his PSA to 9.23, he underwent an MRI of the prostate on 04/05/17 that confirmed a vague PI-RADS 3 lesion at the left mid lateral. He proceeded with an MR/US fusion biopsy on 05/06/17 that demonstrated Gleason 3+4=7 adenocarcinoma in 1 out of 3 MRI targeted biopsies and Gleason 3+3=6 adenocarcinoma in 1 out of 12 systematic TRUS biopsies.   Family history: None.   Imaging studies: MRI (04/05/17): No EPE, SVI, or LAD.   PMH: He has a history of hypertension.  PSH: He has undergone prior inguinal hernia repairs.   TNM stage: T1c N0 Mx  PSA: 9.23  Gleason score: 3+4=7  Biopsy (05/06/17): 2/15 cores positive  Left: L lateral apex (5%, 3+3=6)  Right: Benign  MR targeted: 1/3 cores positive (5%, 3+4=7)  Prostate volume: 62 cc  PSAD: 0.15   Nomogram  OC disease: 54%  EPE: 45%  SVI: 1%  LNI: 2%  PFS (5 year, 10 year): 87%,78%   Urinary function: IPSS was 31. He takes tamsulosin and finasteride. He started both of these medications 2 months ago. Max flow rate has been measured at 6 cc/sec with PVR of 54 cc. his symptoms have significantly improved since he began medical therapy. IPSS today is 5.  Erectile function: SHIM score is 3. He is not sexually active currently.     ALLERGIES: Diltiazem    MEDICATIONS: Finasteride 5 mg tablet 1 tablet PO Daily  Tamsulosin Hcl 0.4 mg capsule, ext release 24 hr 1 capsule PO Daily  Aspirin 81 MG TABS Oral  Atenolol-Chlorthalidone 50 mg-25 mg tablet     GU PSH: Complex Uroflow - 06/10/2017 Prostate Needle Biopsy - 05/06/2017      PSH Notes: Inguinal Hernia Repair, Inguinal Hernia Repair, Tonsillectomy   NON-GU PSH: Inguinal Hernia Repair > 5 yrs, Left - 2013 Remove Tonsils - 2008 Surgical Pathology, Gross And Microscopic Examination For Prostate Needle - 05/06/2017    GU PMH: ED due to arterial  insufficiency, His IPSS is 3. - 06/10/2017 Prostate Cancer, He has T1c Nx Mx intermediate risk prostate cancer with low volume disease and minimal pattern 4 involvement. I discussed the options and will have him see Dr. Alinda Money and Dr. Tammi Klippel. He is leaning toward a seed implant but with his severe LUT's I think he would do best with RALP and if he does elected RTx, he will need his LUTs dealt with. He was given the 100 question book to review. - 06/10/2017 Elevated PSA - 05/06/2017, (Worsening), His PSA was up again and the MRI showed a lesion in the left prostate. I will get him set up for an MR fusion biopsy and I reviewed the risks of bleeding, infection and voiding difficulty. Levaquin sent., - 04/14/2017, Elevated prostate specific antigen (PSA), - 2016 BPH w/LUTS (Worsening), His symptoms have worsened off of tamsulosin. I will resume the med and have him return in 3 months for voiding studies. - 03/01/2017, Benign prostatic hyperplasia with urinary obstruction, - 2016 Weak Urinary Stream - 03/01/2017 Nocturia, Nocturia - 2016 Urge incontinence, Urge incontinence of urine - 2016 BPH w/o LUTS, Benign Prostatic Hypertrophy - 2014 Low back pain, Lower back pain - 2014 Unil Inguinal Hernia W/O obst or gang,non-recurrent, Inguinal Hernia - 2014 Urinary Retention, Unspec, Incomplete bladder emptying - 2014    NON-GU PMH: Encounter for  general adult medical examination without abnormal findings, Encounter for preventive health examination - 2015/03/27 Personal history of other diseases of the circulatory system, History of essential hypertension - 03-26-13    FAMILY HISTORY: Death In The Family Father - Father Death In The Family Mother - Mother Dementia - Mother Essential Hypertension - Father Family Health Status Number - Runs In Family   SOCIAL HISTORY: Marital Status: Married Preferred Language: English; Race: Black or African American Current Smoking Status: Patient does not smoke anymore. Has not smoked  since 02/04/1977.   Tobacco Use Assessment Completed: Used Tobacco in last 30 days? Drinks 2 caffeinated drinks per day.     Notes: Former smoker, Marital History - Widowed, Occupation:, Tobacco Use, Marital History - Currently Married, Caffeine Use, Alcohol Use   REVIEW OF SYSTEMS:    GU Review Male:   Patient reports get up at night to urinate and stream starts and stops. Patient denies frequent urination, hard to postpone urination, burning/ pain with urination, leakage of urine, trouble starting your streams, and have to strain to urinate .  Gastrointestinal (Upper):   Patient denies nausea and vomiting.  Gastrointestinal (Lower):   Patient denies diarrhea and constipation.  Constitutional:   Patient denies weight loss, fatigue, fever, and night sweats.  Skin:   Patient denies skin rash/ lesion and itching.  Eyes:   Patient denies blurred vision and double vision.  Ears/ Nose/ Throat:   Patient denies sore throat and sinus problems.  Hematologic/Lymphatic:   Patient denies swollen glands and easy bruising.  Cardiovascular:   Patient denies leg swelling and chest pains.  Respiratory:   Patient denies cough and shortness of breath.  Endocrine:   Patient denies excessive thirst.  Musculoskeletal:   Patient denies back pain and joint pain.  Neurological:   Patient denies headaches and dizziness.  Psychologic:   Patient denies depression and anxiety.   VITAL SIGNS:     Weight 152 lb / 68.95 kg  Height 70 in / 177.8 cm  BMI 21.8 kg/m   GU PHYSICAL EXAMINATION:    Prostate: Prostate about 60 grams. Left lobe normal consistency, right lobe normal consistency. Symmetrical lobes. No prostate nodule. Left lobe no tenderness, right lobe no tenderness.    MULTI-SYSTEM PHYSICAL EXAMINATION:    Constitutional: Well-nourished. No physical deformities. Normally developed. Good grooming.  Neck: Neck symmetrical, not swollen. Normal tracheal position.  Respiratory: No labored breathing, no use of  accessory muscles. Clear bilaterally.  Cardiovascular: Normal temperature, normal extremity pulses, no swelling, no varicosities. Regular rate and rhythm.  Lymphatic: No enlargement of neck, axillae, groin.  Skin: No paleness, no jaundice, no cyanosis. No lesion, no ulcer, no rash.  Neurologic / Psychiatric: Oriented to time, oriented to place, oriented to person. No depression, no anxiety, no agitation.  Gastrointestinal: No mass, no tenderness, no rigidity, non obese abdomen.  Eyes: Normal conjunctivae. Normal eyelids.  Ears, Nose, Mouth, and Throat: Left ear no scars, no lesions, no masses. Right ear no scars, no lesions, no masses. Nose no scars, no lesions, no masses. Normal hearing. Normal lips.  Musculoskeletal: Normal gait and station of head and neck.      ASSESSMENT:      ICD-10 Details  1 GU:   Prostate Cancer - C61    PLAN:        1. Prostate cancer: He will undergo a bilateral nerve sparing robot-assisted laparoscopic radical prostatectomy and pelvic lymphadenectomy.

## 2017-09-06 ENCOUNTER — Encounter (HOSPITAL_COMMUNITY)
Admission: RE | Disposition: A | Discharge status: Home / Self Care | Payer: Self-pay | Source: Ambulatory Visit | Attending: Urology

## 2017-09-06 ENCOUNTER — Encounter (HOSPITAL_COMMUNITY): Payer: Self-pay | Admitting: *Deleted

## 2017-09-06 ENCOUNTER — Ambulatory Visit (HOSPITAL_COMMUNITY): Payer: Medicare Other | Admitting: Certified Registered Nurse Anesthetist

## 2017-09-06 ENCOUNTER — Observation Stay (HOSPITAL_COMMUNITY)
Admission: RE | Admit: 2017-09-06 | Discharge: 2017-09-07 | Disposition: A | Discharge status: Home / Self Care | Payer: Medicare Other | Source: Ambulatory Visit | Attending: Urology | Admitting: Urology

## 2017-09-06 DIAGNOSIS — Z7982 Long term (current) use of aspirin: Secondary | ICD-10-CM | POA: Insufficient documentation

## 2017-09-06 DIAGNOSIS — C61 Malignant neoplasm of prostate: Principal | ICD-10-CM | POA: Diagnosis present

## 2017-09-06 DIAGNOSIS — I10 Essential (primary) hypertension: Secondary | ICD-10-CM | POA: Insufficient documentation

## 2017-09-06 DIAGNOSIS — L818 Other specified disorders of pigmentation: Secondary | ICD-10-CM | POA: Insufficient documentation

## 2017-09-06 DIAGNOSIS — Z888 Allergy status to other drugs, medicaments and biological substances status: Secondary | ICD-10-CM | POA: Diagnosis not present

## 2017-09-06 DIAGNOSIS — R972 Elevated prostate specific antigen [PSA]: Secondary | ICD-10-CM | POA: Diagnosis not present

## 2017-09-06 DIAGNOSIS — Z79899 Other long term (current) drug therapy: Secondary | ICD-10-CM | POA: Diagnosis not present

## 2017-09-06 DIAGNOSIS — N401 Enlarged prostate with lower urinary tract symptoms: Secondary | ICD-10-CM | POA: Diagnosis not present

## 2017-09-06 DIAGNOSIS — N138 Other obstructive and reflux uropathy: Secondary | ICD-10-CM | POA: Diagnosis not present

## 2017-09-06 DIAGNOSIS — Z87891 Personal history of nicotine dependence: Secondary | ICD-10-CM | POA: Diagnosis not present

## 2017-09-06 HISTORY — PX: LYMPHADENECTOMY: SHX5960

## 2017-09-06 HISTORY — PX: ROBOT ASSISTED LAPAROSCOPIC RADICAL PROSTATECTOMY: SHX5141

## 2017-09-06 LAB — HEMOGLOBIN AND HEMATOCRIT, BLOOD
HEMATOCRIT: 42.7 % (ref 39.0–52.0)
HEMOGLOBIN: 15.1 g/dL (ref 13.0–17.0)

## 2017-09-06 SURGERY — XI ROBOTIC ASSISTED LAPAROSCOPIC RADICAL PROSTATECTOMY LEVEL 2
Anesthesia: General

## 2017-09-06 MED ORDER — DOCUSATE SODIUM 100 MG PO CAPS
100.0000 mg | ORAL_CAPSULE | Freq: Two times a day (BID) | ORAL | Status: DC
  Administered 2017-09-06 – 2017-09-07 (×2): 100 mg via ORAL
  Filled 2017-09-06 (×2): qty 1

## 2017-09-06 MED ORDER — FENTANYL CITRATE (PF) 250 MCG/5ML IJ SOLN
INTRAMUSCULAR | Status: AC
  Filled 2017-09-06: qty 5

## 2017-09-06 MED ORDER — LACTATED RINGERS IV SOLN
INTRAVENOUS | Status: DC
  Administered 2017-09-06 (×3): via INTRAVENOUS

## 2017-09-06 MED ORDER — FENTANYL CITRATE (PF) 100 MCG/2ML IJ SOLN
INTRAMUSCULAR | Status: AC
  Filled 2017-09-06: qty 2

## 2017-09-06 MED ORDER — PHENYLEPHRINE 40 MCG/ML (10ML) SYRINGE FOR IV PUSH (FOR BLOOD PRESSURE SUPPORT)
PREFILLED_SYRINGE | INTRAVENOUS | Status: DC | PRN
  Administered 2017-09-06: 80 ug via INTRAVENOUS
  Administered 2017-09-06: 40 ug via INTRAVENOUS

## 2017-09-06 MED ORDER — KETOROLAC TROMETHAMINE 15 MG/ML IJ SOLN
15.0000 mg | Freq: Four times a day (QID) | INTRAMUSCULAR | Status: DC
Start: 2017-09-06 — End: 2017-09-07
  Administered 2017-09-06: 15 mg via INTRAVENOUS

## 2017-09-06 MED ORDER — KCL IN DEXTROSE-NACL 20-5-0.45 MEQ/L-%-% IV SOLN
INTRAVENOUS | Status: DC
  Administered 2017-09-06: 19:00:00 via INTRAVENOUS
  Filled 2017-09-06: qty 1000

## 2017-09-06 MED ORDER — SODIUM CHLORIDE 0.9 % IV BOLUS (SEPSIS)
1000.0000 mL | Freq: Once | INTRAVENOUS | Status: AC
  Administered 2017-09-06: 1000 mL via INTRAVENOUS

## 2017-09-06 MED ORDER — ONDANSETRON HCL 4 MG/2ML IJ SOLN: 4.0000 mg | Freq: Once | INTRAMUSCULAR | Status: DC | PRN

## 2017-09-06 MED ORDER — MORPHINE SULFATE (PF) 4 MG/ML IV SOLN: 2.0000 mg | INTRAVENOUS | Status: DC | PRN

## 2017-09-06 MED ORDER — DIPHENHYDRAMINE HCL 12.5 MG/5ML PO ELIX
12.5000 mg | ORAL_SOLUTION | Freq: Four times a day (QID) | ORAL | Status: DC | PRN
  Filled 2017-09-06: qty 10

## 2017-09-06 MED ORDER — KCL IN DEXTROSE-NACL 20-5-0.45 MEQ/L-%-% IV SOLN
INTRAVENOUS | Status: AC
  Filled 2017-09-06: qty 1000

## 2017-09-06 MED ORDER — DIPHENHYDRAMINE HCL 50 MG/ML IJ SOLN: 12.5000 mg | Freq: Four times a day (QID) | INTRAMUSCULAR | Status: DC | PRN

## 2017-09-06 MED ORDER — CHLORTHALIDONE 25 MG PO TABS
12.5000 mg | ORAL_TABLET | Freq: Every day | ORAL | Status: DC
  Administered 2017-09-06 – 2017-09-07 (×2): 12.5 mg via ORAL
  Filled 2017-09-06 (×4): qty 0.5

## 2017-09-06 MED ORDER — OXYCODONE HCL 5 MG/5ML PO SOLN
5.0000 mg | Freq: Once | ORAL | Status: DC | PRN
  Filled 2017-09-06: qty 5

## 2017-09-06 MED ORDER — LIDOCAINE 2% (20 MG/ML) 5 ML SYRINGE
INTRAMUSCULAR | Status: AC
  Filled 2017-09-06: qty 5

## 2017-09-06 MED ORDER — MIDAZOLAM HCL 5 MG/5ML IJ SOLN
INTRAMUSCULAR | Status: DC | PRN
  Administered 2017-09-06: 2 mg via INTRAVENOUS

## 2017-09-06 MED ORDER — CEFAZOLIN SODIUM-DEXTROSE 2-4 GM/100ML-% IV SOLN
2.0000 g | INTRAVENOUS | Status: AC
  Administered 2017-09-06: 2 g via INTRAVENOUS
  Filled 2017-09-06: qty 100

## 2017-09-06 MED ORDER — DEXAMETHASONE SODIUM PHOSPHATE 4 MG/ML IJ SOLN
INTRAMUSCULAR | Status: DC | PRN
  Administered 2017-09-06: 10 mg via INTRAVENOUS

## 2017-09-06 MED ORDER — PROPOFOL 10 MG/ML IV BOLUS
INTRAVENOUS | Status: AC
  Filled 2017-09-06: qty 20

## 2017-09-06 MED ORDER — SULFAMETHOXAZOLE-TRIMETHOPRIM 800-160 MG PO TABS: 1.0000 | ORAL_TABLET | Freq: Two times a day (BID) | ORAL | 0 refills | Status: DC

## 2017-09-06 MED ORDER — OXYCODONE HCL 5 MG PO TABS: 5.0000 mg | ORAL_TABLET | Freq: Once | ORAL | Status: DC | PRN

## 2017-09-06 MED ORDER — BUPIVACAINE-EPINEPHRINE 0.25% -1:200000 IJ SOLN
INTRAMUSCULAR | Status: DC | PRN
  Administered 2017-09-06: 30 mL

## 2017-09-06 MED ORDER — MIDAZOLAM HCL 2 MG/2ML IJ SOLN
INTRAMUSCULAR | Status: AC
  Filled 2017-09-06: qty 2

## 2017-09-06 MED ORDER — PROPOFOL 10 MG/ML IV BOLUS
INTRAVENOUS | Status: DC | PRN
  Administered 2017-09-06: 30 mg via INTRAVENOUS
  Administered 2017-09-06: 120 mg via INTRAVENOUS

## 2017-09-06 MED ORDER — ONDANSETRON HCL 4 MG/2ML IJ SOLN
INTRAMUSCULAR | Status: AC
  Filled 2017-09-06: qty 2

## 2017-09-06 MED ORDER — CEFAZOLIN SODIUM-DEXTROSE 1-4 GM/50ML-% IV SOLN
1.0000 g | Freq: Three times a day (TID) | INTRAVENOUS | Status: AC
  Administered 2017-09-06 – 2017-09-07 (×2): 1 g via INTRAVENOUS
  Filled 2017-09-06 (×3): qty 50

## 2017-09-06 MED ORDER — DEXAMETHASONE SODIUM PHOSPHATE 10 MG/ML IJ SOLN
INTRAMUSCULAR | Status: AC
  Filled 2017-09-06: qty 1

## 2017-09-06 MED ORDER — ATENOLOL 25 MG PO TABS
25.0000 mg | ORAL_TABLET | Freq: Every day | ORAL | Status: DC
  Administered 2017-09-06 – 2017-09-07 (×2): 25 mg via ORAL
  Filled 2017-09-06 (×2): qty 1

## 2017-09-06 MED ORDER — ONDANSETRON HCL 4 MG/2ML IJ SOLN
INTRAMUSCULAR | Status: DC | PRN
  Administered 2017-09-06: 4 mg via INTRAVENOUS

## 2017-09-06 MED ORDER — KETOROLAC TROMETHAMINE 15 MG/ML IJ SOLN
INTRAMUSCULAR | Status: AC
  Filled 2017-09-06: qty 1

## 2017-09-06 MED ORDER — HEPARIN SODIUM (PORCINE) 1000 UNIT/ML IJ SOLN
INTRAMUSCULAR | Status: AC
  Filled 2017-09-06: qty 1

## 2017-09-06 MED ORDER — ROCURONIUM BROMIDE 10 MG/ML (PF) SYRINGE
PREFILLED_SYRINGE | INTRAVENOUS | Status: DC | PRN
  Administered 2017-09-06: 50 mg via INTRAVENOUS

## 2017-09-06 MED ORDER — SUCCINYLCHOLINE CHLORIDE 200 MG/10ML IV SOSY
PREFILLED_SYRINGE | INTRAVENOUS | Status: AC
  Filled 2017-09-06: qty 10

## 2017-09-06 MED ORDER — ROCURONIUM BROMIDE 50 MG/5ML IV SOSY
PREFILLED_SYRINGE | INTRAVENOUS | Status: AC
  Filled 2017-09-06: qty 5

## 2017-09-06 MED ORDER — SODIUM CHLORIDE 0.9 % IR SOLN
Status: DC | PRN
Start: 2017-09-06 — End: 2017-09-06
  Administered 2017-09-06: 1000 mL

## 2017-09-06 MED ORDER — SUGAMMADEX SODIUM 200 MG/2ML IV SOLN
INTRAVENOUS | Status: DC | PRN
  Administered 2017-09-06: 200 mg via INTRAVENOUS

## 2017-09-06 MED ORDER — FENTANYL CITRATE (PF) 100 MCG/2ML IJ SOLN: 25.0000 ug | INTRAMUSCULAR | Status: DC | PRN

## 2017-09-06 MED ORDER — LACTATED RINGERS IV SOLN
INTRAVENOUS | Status: DC | PRN
  Administered 2017-09-06: 13:00:00

## 2017-09-06 MED ORDER — EPHEDRINE SULFATE-NACL 50-0.9 MG/10ML-% IV SOSY
PREFILLED_SYRINGE | INTRAVENOUS | Status: DC | PRN
  Administered 2017-09-06: 5 mg via INTRAVENOUS
  Administered 2017-09-06 (×3): 10 mg via INTRAVENOUS

## 2017-09-06 MED ORDER — FENTANYL CITRATE (PF) 100 MCG/2ML IJ SOLN
25.0000 ug | INTRAMUSCULAR | Status: DC | PRN
  Administered 2017-09-06 (×2): 50 ug via INTRAVENOUS

## 2017-09-06 MED ORDER — LIDOCAINE 2% (20 MG/ML) 5 ML SYRINGE
INTRAMUSCULAR | Status: DC | PRN
  Administered 2017-09-06: 60 mg via INTRAVENOUS

## 2017-09-06 MED ORDER — PHENYLEPHRINE 40 MCG/ML (10ML) SYRINGE FOR IV PUSH (FOR BLOOD PRESSURE SUPPORT)
PREFILLED_SYRINGE | INTRAVENOUS | Status: AC
  Filled 2017-09-06: qty 10

## 2017-09-06 MED ORDER — EPHEDRINE 5 MG/ML INJ
INTRAVENOUS | Status: AC
  Filled 2017-09-06: qty 10

## 2017-09-06 MED ORDER — ATENOLOL-CHLORTHALIDONE 50-25 MG PO TABS: 0.5000 | ORAL_TABLET | Freq: Every day | ORAL | Status: DC

## 2017-09-06 MED ORDER — ACETAMINOPHEN 325 MG PO TABS: 650.0000 mg | ORAL_TABLET | ORAL | Status: DC | PRN

## 2017-09-06 MED ORDER — HYDROCODONE-ACETAMINOPHEN 5-325 MG PO TABS: 1.0000 | ORAL_TABLET | Freq: Four times a day (QID) | ORAL | 0 refills | Status: DC | PRN

## 2017-09-06 MED ORDER — SUCCINYLCHOLINE CHLORIDE 200 MG/10ML IV SOSY
PREFILLED_SYRINGE | INTRAVENOUS | Status: DC | PRN
  Administered 2017-09-06: 100 mg via INTRAVENOUS

## 2017-09-06 MED ORDER — FENTANYL CITRATE (PF) 100 MCG/2ML IJ SOLN
INTRAMUSCULAR | Status: DC | PRN
  Administered 2017-09-06: 100 ug via INTRAVENOUS
  Administered 2017-09-06 (×5): 50 ug via INTRAVENOUS

## 2017-09-06 MED ORDER — BUPIVACAINE-EPINEPHRINE (PF) 0.25% -1:200000 IJ SOLN
INTRAMUSCULAR | Status: AC
  Filled 2017-09-06: qty 30

## 2017-09-06 SURGICAL SUPPLY — 58 items
APPLICATOR COTTON TIP 6IN STRL (MISCELLANEOUS) ×4 IMPLANT
CATH FOLEY 2WAY SLVR 18FR 30CC (CATHETERS) ×4 IMPLANT
CATH ROBINSON RED A/P 16FR (CATHETERS) ×4 IMPLANT
CATH ROBINSON RED A/P 8FR (CATHETERS) ×4 IMPLANT
CATH TIEMANN FOLEY 18FR 5CC (CATHETERS) ×4 IMPLANT
CHLORAPREP W/TINT 26ML (MISCELLANEOUS) ×4 IMPLANT
CLIP VESOLOCK LG 6/CT PURPLE (CLIP) ×8 IMPLANT
COVER SURGICAL LIGHT HANDLE (MISCELLANEOUS) ×4 IMPLANT
COVER TIP SHEARS 8 DVNC (MISCELLANEOUS) ×2 IMPLANT
COVER TIP SHEARS 8MM DA VINCI (MISCELLANEOUS) ×2
CUTTER ECHEON FLEX ENDO 45 340 (ENDOMECHANICALS) ×4 IMPLANT
DECANTER SPIKE VIAL GLASS SM (MISCELLANEOUS) IMPLANT
DERMABOND ADVANCED (GAUZE/BANDAGES/DRESSINGS) ×2
DERMABOND ADVANCED .7 DNX12 (GAUZE/BANDAGES/DRESSINGS) ×2 IMPLANT
DRAPE ARM DVNC X/XI (DISPOSABLE) ×8 IMPLANT
DRAPE COLUMN DVNC XI (DISPOSABLE) ×2 IMPLANT
DRAPE DA VINCI XI ARM (DISPOSABLE) ×8
DRAPE DA VINCI XI COLUMN (DISPOSABLE) ×2
DRAPE SURG IRRIG POUCH 19X23 (DRAPES) ×4 IMPLANT
DRSG TEGADERM 4X4.75 (GAUZE/BANDAGES/DRESSINGS) ×8 IMPLANT
ELECT REM PT RETURN 15FT ADLT (MISCELLANEOUS) ×4 IMPLANT
GLOVE BIO SURGEON STRL SZ 6.5 (GLOVE) ×3 IMPLANT
GLOVE BIO SURGEONS STRL SZ 6.5 (GLOVE) ×1
GLOVE BIOGEL M STRL SZ7.5 (GLOVE) ×8 IMPLANT
GLOVE BIOGEL PI IND STRL 7.0 (GLOVE) ×8 IMPLANT
GLOVE BIOGEL PI INDICATOR 7.0 (GLOVE) ×8
GLOVE SURG SS PI 6.5 STRL IVOR (GLOVE) ×8 IMPLANT
GOWN STRL REUS W/TWL LRG LVL3 (GOWN DISPOSABLE) ×24 IMPLANT
HOLDER FOLEY CATH W/STRAP (MISCELLANEOUS) ×4 IMPLANT
IRRIG SUCT STRYKERFLOW 2 WTIP (MISCELLANEOUS) ×4
IRRIGATION SUCT STRKRFLW 2 WTP (MISCELLANEOUS) ×2 IMPLANT
IV LACTATED RINGERS 1000ML (IV SOLUTION) IMPLANT
NDL SAFETY ECLIPSE 18X1.5 (NEEDLE) ×2 IMPLANT
NEEDLE HYPO 18GX1.5 SHARP (NEEDLE) ×2
PACK ROBOT UROLOGY CUSTOM (CUSTOM PROCEDURE TRAY) ×4 IMPLANT
SEAL CANN UNIV 5-8 DVNC XI (MISCELLANEOUS) ×8 IMPLANT
SEAL XI 5MM-8MM UNIVERSAL (MISCELLANEOUS) ×8
SOLUTION ELECTROLUBE (MISCELLANEOUS) ×4 IMPLANT
STAPLE RELOAD 45 GRN (STAPLE) ×2 IMPLANT
STAPLE RELOAD 45MM GREEN (STAPLE) ×2
SUT ETHILON 3 0 PS 1 (SUTURE) ×4 IMPLANT
SUT MNCRL 3 0 RB1 (SUTURE) ×2 IMPLANT
SUT MNCRL 3 0 VIOLET RB1 (SUTURE) ×2 IMPLANT
SUT MNCRL AB 4-0 PS2 18 (SUTURE) ×8 IMPLANT
SUT MONOCRYL 3 0 RB1 (SUTURE) ×4
SUT VIC AB 0 CT1 27 (SUTURE) ×2
SUT VIC AB 0 CT1 27XBRD ANTBC (SUTURE) ×2 IMPLANT
SUT VIC AB 0 UR5 27 (SUTURE) ×4 IMPLANT
SUT VIC AB 2-0 SH 27 (SUTURE) ×2
SUT VIC AB 2-0 SH 27X BRD (SUTURE) ×2 IMPLANT
SUT VIC AB 3-0 SH 27 (SUTURE) ×2
SUT VIC AB 3-0 SH 27X BRD (SUTURE) ×2 IMPLANT
SUT VICRYL 0 UR6 27IN ABS (SUTURE) ×8 IMPLANT
SYR 27GX1/2 1ML LL SAFETY (SYRINGE) ×4 IMPLANT
TOWEL OR 17X26 10 PK STRL BLUE (TOWEL DISPOSABLE) ×4 IMPLANT
TOWEL OR NON WOVEN STRL DISP B (DISPOSABLE) ×4 IMPLANT
TUBING INSUFFLATION 10FT LAP (TUBING) IMPLANT
WATER STERILE IRR 1000ML POUR (IV SOLUTION) IMPLANT

## 2017-09-06 NOTE — Anesthesia Preprocedure Evaluation (Signed)
Anesthesia Evaluation  Patient identified by MRN, date of birth, ID band Patient awake    Reviewed: Allergy & Precautions, NPO status , Patient's Chart, lab work & pertinent test results  Airway Mallampati: II  TM Distance: >3 FB     Dental  (+) Edentulous Upper, Edentulous Lower   Pulmonary former smoker,    breath sounds clear to auscultation       Cardiovascular hypertension,  Rhythm:Regular Rate:Normal     Neuro/Psych    GI/Hepatic   Endo/Other    Renal/GU      Musculoskeletal   Abdominal   Peds  Hematology   Anesthesia Other Findings   Reproductive/Obstetrics                             Anesthesia Physical Anesthesia Plan  ASA: II  Anesthesia Plan: General   Post-op Pain Management:    Induction: Intravenous  PONV Risk Score and Plan: Ondansetron and Dexamethasone  Airway Management Planned: Oral ETT  Additional Equipment:   Intra-op Plan:   Post-operative Plan: Extubation in OR  Informed Consent: I have reviewed the patients History and Physical, chart, labs and discussed the procedure including the risks, benefits and alternatives for the proposed anesthesia with the patient or authorized representative who has indicated his/her understanding and acceptance.     Plan Discussed with: CRNA and Anesthesiologist  Anesthesia Plan Comments:         Anesthesia Quick Evaluation

## 2017-09-06 NOTE — Progress Notes (Signed)
PACU Nursing Note: Pt cont to await room assignment, remains in Phase I PACU area. Pt up to ambulate in hallway. Gait noted to be very steady, pt ambulated with RN and son, required min assistance, tolerated ambulation very well w/o complaint. Pt returned back to stretcher, comfort measures in place, stretcher placed in lowest position and nurse call device provided to pt and explained how to use call system. Pt informed to not get off stretcher unless nsg staff with patient. Pt verbally stated how to use call bell and stated that he understood not to get up without staff assistance.

## 2017-09-06 NOTE — Transfer of Care (Signed)
Immediate Anesthesia Transfer of Care Note  Patient: Peter Lam  Procedure(s) Performed: Procedure(s): XI ROBOTIC ASSISTED LAPAROSCOPIC RADICAL PROSTATECTOMY LEVEL 2 (N/A) PELVIC LYMPHADENECTOMY (Bilateral)  Patient Location: PACU  Anesthesia Type:General  Level of Consciousness:  sedated, patient cooperative and responds to stimulation  Airway & Oxygen Therapy:Patient Spontanous Breathing and Patient connected to face mask oxgen  Post-op Assessment:  Report given to PACU RN and Post -op Vital signs reviewed and stable  Post vital signs:  Reviewed and stable  Last Vitals:  Vitals:   09/06/17 0906  BP: 135/87  Pulse: 81  Resp: 18  Temp: (!) 36.4 C  SpO2: 130%    Complications: No apparent anesthesia complications

## 2017-09-06 NOTE — Op Note (Signed)
Preoperative diagnosis: Clinically localized adenocarcinoma of the prostate (clinical stage T1c Nx Mx)  Postoperative diagnosis: Clinically localized adenocarcinoma of the prostate (clinical stage T1c Nx Mx)  Procedure:  1. Robotic assisted laparoscopic radical prostatectomy (bilateral nerve sparing) 2. Bilateral robotic assisted laparoscopic pelvic lymphadenectomy  Surgeon: Pryor Curia. M.D.  Assistant: Debbrah Alar, PA-C  An assistant was required for this surgical procedure.  The duties of the assistant included but were not limited to suctioning, passing suture, camera manipulation, retraction. This procedure would not be able to be performed without an Environmental consultant.  Resident: Dr. Jeralyn Ruths  Anesthesia: General  Complications: None  EBL: 50 mL  IVF:  1000 mL crystalloid  Specimens: 1. Prostate and seminal vesicles 2. Right pelvic lymph nodes 3. Left pelvic lymph nodes 4. Pigmented peritoneal lesion  Disposition of specimens: Pathology  Drains: 1. 20 Fr coude catheter 2. # 19 Blake pelvic drain  Indication: Peter Lam is a 71 y.o. year old patient with clinically localized prostate cancer.  After a thorough review of the management options for treatment of prostate cancer, he elected to proceed with surgical therapy and the above procedure(s).  We have discussed the potential benefits and risks of the procedure, side effects of the proposed treatment, the likelihood of the patient achieving the goals of the procedure, and any potential problems that might occur during the procedure or recuperation. Informed consent has been obtained.  Description of procedure:  The patient was taken to the operating room and a general anesthetic was administered. He was given preoperative antibiotics, placed in the dorsal lithotomy position, and prepped and draped in the usual sterile fashion. Next a preoperative timeout was performed. A urethral catheter was placed into the  bladder and a site was selected near the umbilicus for placement of the camera port. This was placed using a standard open Hassan technique which allowed entry into the peritoneal cavity under direct vision and without difficulty. An 8 mm robotic port was placed and a pneumoperitoneum established. The camera was then used to inspect the abdomen and there was no evidence of any intra-abdominal injuries or other abnormalities. There were noted to be numerous small pigmented lesions throughout the peritoneal surface. The remaining abdominal ports were then placed. 8 mm robotic ports were placed in the right lower quadrant, left lower quadrant, and far left lateral abdominal wall. A 5 mm port was placed in the right upper quadrant and a 12 mm port was placed in the right lateral abdominal wall for laparoscopic assistance. All ports were placed under direct vision without difficulty. The surgical cart was then docked.   Utilizing the cautery scissors, the bladder was reflected posteriorly allowing entry into the space of Retzius and identification of the endopelvic fascia and prostate. The periprostatic fat was then removed from the prostate allowing full exposure of the endopelvic fascia. The endopelvic fascia was then incised from the apex back to the base of the prostate bilaterally and the underlying levator muscle fibers were swept laterally off the prostate thereby isolating the dorsal venous complex. The dorsal vein was then stapled and divided with a 45 mm Flex Echelon stapler. Attention then turned to the bladder neck which was divided anteriorly thereby allowing entry into the bladder and exposure of the urethral catheter. The catheter balloon was deflated and the catheter was brought into the operative field and used to retract the prostate anteriorly. The posterior bladder neck was then examined and was divided allowing further dissection between the  bladder and prostate posteriorly until the vasa  deferentia and seminal vessels were identified. The vasa deferentia were isolated, divided, and lifted anteriorly. The seminal vesicles were dissected down to their tips with care to control the seminal vascular arterial blood supply. These structures were then lifted anteriorly and the space between Denonvillier's fascia and the anterior rectum was developed with a combination of sharp and blunt dissection. This isolated the vascular pedicles of the prostate.  The lateral prostatic fascia was then sharply incised allowing release of the neurovascular bundles bilaterally. The vascular pedicles of the prostate were then ligated with Weck clips between the prostate and neurovascular bundles and divided with sharp cold scissor dissection resulting in neurovascular bundle preservation. The neurovascular bundles were then separated off the apex of the prostate and urethra bilaterally.  The urethra was then sharply transected allowing the prostate specimen to be disarticulated. The pelvis was copiously irrigated and hemostasis was ensured. There was no evidence for rectal injury.  Attention then turned to the right pelvic sidewall. The fibrofatty tissue between the external iliac vein, confluence of the iliac vessels, hypogastric artery, and Cooper's ligament was dissected free from the pelvic sidewall with care to preserve the obturator nerve. Weck clips were used for lymphostasis and hemostasis. An identical procedure was performed on the contralateral side and the lymphatic packets were removed for permanent pathologic analysis.  A sampled portion of peritoneal surface off the bladder with the previously mentioned pigmented areas was excised for pathologic analysis.  Attention then turned to the urethral anastomosis. A 2-0 Vicryl slip knot was placed between Denonvillier's fascia, the posterior bladder neck, and the posterior urethra to reapproximate these structures. A double-armed 3-0 Monocryl suture was  then used to perform a 360 running tension-free anastomosis between the bladder neck and urethra. A new urethral catheter was then placed into the bladder and irrigated. There were no blood clots within the bladder and the anastomosis appeared to be watertight. A #19 Blake drain was then brought through the left lateral 8 mm port site and positioned appropriately within the pelvis. It was secured to the skin with a nylon suture. The surgical cart was then undocked. The right lateral 12 mm port site was closed at the fascial level with a 0 Vicryl suture placed laparoscopically. All remaining ports were then removed under direct vision. The prostate specimen was removed intact within the Endopouch retrieval bag via the periumbilical camera port site. This fascial opening was closed with two running 0 Vicryl sutures. 0.25% Marcaine was then injected into all port sites and all incisions were reapproximated at the skin level with 4-0 Monocryl subcuticular sutures and Liquiband. The patient appeared to tolerate the procedure well and without complications. The patient was able to be extubated and transferred to the recovery unit in satisfactory condition.   Pryor Curia MD

## 2017-09-06 NOTE — Anesthesia Postprocedure Evaluation (Signed)
Anesthesia Post Note  Patient: Peter Lam  Procedure(s) Performed: XI ROBOTIC ASSISTED LAPAROSCOPIC RADICAL PROSTATECTOMY LEVEL 2 (N/A ) PELVIC LYMPHADENECTOMY (Bilateral )     Patient location during evaluation: PACU Anesthesia Type: General Level of consciousness: awake, awake and alert and oriented Pain management: pain level controlled Vital Signs Assessment: post-procedure vital signs reviewed and stable Respiratory status: spontaneous breathing, nonlabored ventilation and respiratory function stable Cardiovascular status: blood pressure returned to baseline Anesthetic complications: no    Last Vitals:  Vitals:   09/06/17 1839 09/06/17 1900  BP: (!) 150/80   Pulse: (!) 106 (!) 102  Resp:    Temp:  36.7 C  SpO2: 100%     Last Pain:  Vitals:   09/06/17 1630  TempSrc:   PainSc: 3                  Shanteria Laye COKER

## 2017-09-06 NOTE — Progress Notes (Signed)
Patient ID: Peter Lam, male   DOB: [birth year], 71 y.o.   MRN: [id number]  Post-op note  Subjective: The patient is doing well.  No complaints.  Objective: Vital signs in last 24 hours: Temp:  [97.5 F (36.4 C)-97.6 F (36.4 C)] 97.6 F (36.4 C) (10/01 1630) Pulse Rate:  [81-106] 106 (10/01 1839) Resp:  [18-26] 19 (10/01 1700) BP: (132-157)/(78-92) 150/80 (10/01 1839) SpO2:  [90 %-100 %] 100 % (10/01 1839) Weight:  [67.6 kg (149 lb)] 67.6 kg (149 lb) (10/01 0906)  Intake/Output from previous day: No intake/output data recorded. Intake/Output this shift: Total I/O In: 2400 [I.V.:1400; IV Piggyback:1000] Out: 50 [Blood:50]  Physical Exam:  General: Alert and oriented. Abdomen: Soft, Nondistended. Incisions: Clean and dry. Urine: Clear  Lab Results:  Recent Labs  09/06/17 1542  HGB 15.1  HCT 42.7    Assessment/Plan: POD#0   1) Continue to monitor, ambulate, IS   Pryor Curia. MD   LOS: 0 days   Isabel Freese,LES 09/06/2017, 6:47 PM

## 2017-09-06 NOTE — Anesthesia Procedure Notes (Signed)
Procedure Name: MAC Date/Time: 09/06/2017 12:10 PM Performed by: Claudia Desanctis Pre-anesthesia Checklist: Patient identified, Emergency Drugs available, Suction available, Patient being monitored and Timeout performed Patient Re-evaluated:Patient Re-evaluated prior to induction Oxygen Delivery Method: Circle system utilized Preoxygenation: Pre-oxygenation with 100% oxygen Induction Type: IV induction Ventilation: Mask ventilation without difficulty Laryngoscope Size: Miller and 2 Grade View: Grade I Tube type: Oral Tube size: 7.5 mm Number of attempts: 1 Airway Equipment and Method: Patient positioned with wedge pillow and Stylet Placement Confirmation: ETT inserted through vocal cords under direct vision,  positive ETCO2,  CO2 detector and breath sounds checked- equal and bilateral Secured at: 22 cm Tube secured with: Tape

## 2017-09-06 NOTE — Discharge Instructions (Signed)

## 2017-09-07 ENCOUNTER — Encounter (HOSPITAL_COMMUNITY): Payer: Self-pay | Admitting: Urology

## 2017-09-07 DIAGNOSIS — C61 Malignant neoplasm of prostate: Secondary | ICD-10-CM | POA: Diagnosis not present

## 2017-09-07 DIAGNOSIS — L818 Other specified disorders of pigmentation: Secondary | ICD-10-CM | POA: Diagnosis not present

## 2017-09-07 DIAGNOSIS — I10 Essential (primary) hypertension: Secondary | ICD-10-CM | POA: Diagnosis not present

## 2017-09-07 DIAGNOSIS — Z7982 Long term (current) use of aspirin: Secondary | ICD-10-CM | POA: Diagnosis not present

## 2017-09-07 DIAGNOSIS — Z79899 Other long term (current) drug therapy: Secondary | ICD-10-CM | POA: Diagnosis not present

## 2017-09-07 DIAGNOSIS — Z888 Allergy status to other drugs, medicaments and biological substances status: Secondary | ICD-10-CM | POA: Diagnosis not present

## 2017-09-07 LAB — HEMOGLOBIN AND HEMATOCRIT, BLOOD
HEMATOCRIT: 41.8 % (ref 39.0–52.0)
HEMOGLOBIN: 15.1 g/dL (ref 13.0–17.0)

## 2017-09-07 MED ORDER — HYDROCODONE-ACETAMINOPHEN 5-325 MG PO TABS: 1.0000 | ORAL_TABLET | Freq: Four times a day (QID) | ORAL | Status: DC | PRN

## 2017-09-07 MED ORDER — BISACODYL 10 MG RE SUPP
10.0000 mg | Freq: Once | RECTAL | Status: AC
  Administered 2017-09-07: 10 mg via RECTAL
  Filled 2017-09-07: qty 1

## 2017-09-07 MED ORDER — INFLUENZA VAC SPLIT HIGH-DOSE 0.5 ML IM SUSY: 0.5000 mL | PREFILLED_SYRINGE | INTRAMUSCULAR | Status: DC

## 2017-09-07 NOTE — Discharge Summary (Signed)
  Date of admission: 09/06/2017  Date of discharge: 09/07/2017  Admission diagnosis: Prostate Cancer  Discharge diagnosis: Prostate Cancer  History and Physical: For full details, please see admission history and physical. Briefly, Peter Lam is a 71 y.o. gentleman with localized prostate cancer.  After discussing management/treatment options, he elected to proceed with surgical treatment.  Hospital Course: ENOCH MOFFA was taken to the operating room on 09/06/2017 and underwent a robotic assisted laparoscopic radical prostatectomy. He tolerated this procedure well and without complications. Postoperatively, he was able to be transferred to a regular hospital room following recovery from anesthesia.  He was able to begin ambulating the night of surgery. He remained hemodynamically stable overnight.  He had excellent urine output with appropriately minimal output from his pelvic drain and his pelvic drain was removed on POD #1.  He was transitioned to oral pain medication, tolerated a clear liquid diet, and had met all discharge criteria and was able to be discharged home later on POD#1.  Laboratory values:  Recent Labs  09/06/17 1542 09/07/17 0516  HGB 15.1 15.1  HCT 42.7 41.8    Disposition: Home  Discharge instruction: He was instructed to be ambulatory but to refrain from heavy lifting, strenuous activity, or driving. He was instructed on urethral catheter care.  Discharge medications:  Allergies as of 09/07/2017      Reactions   Ace Inhibitors Other (See Comments)   Cough   Diltiazem Hcl Other (See Comments)   Exacerbated acid reflux.      Medication List    STOP taking these medications   aspirin EC 81 MG tablet   finasteride 5 MG tablet Commonly known as:  PROSCAR   tamsulosin 0.4 MG Caps capsule Commonly known as:  FLOMAX     TAKE these medications   atenolol-chlorthalidone 50-25 MG tablet Commonly known as:  TENORETIC Take 0.5 tablets by mouth daily.    HYDROcodone-acetaminophen 5-325 MG tablet Commonly known as:  NORCO Take 1-2 tablets by mouth every 6 (six) hours as needed for moderate pain or severe pain.   sulfamethoxazole-trimethoprim 800-160 MG tablet Commonly known as:  BACTRIM DS,SEPTRA DS Take 1 tablet by mouth 2 (two) times daily. Start the day prior to foley removal appointment       Followup: He will followup in 1 week for catheter removal and to discuss his surgical pathology results.

## 2017-09-07 NOTE — Progress Notes (Signed)
Teaching completed on leg bag exchange. Patient verbalized understanding of teaching. Will continue to monitor patient.

## 2017-09-07 NOTE — Progress Notes (Signed)
Patient verbalized understanding of discharge instructions. Patient is stable at discharge. Foley teaching is complete and patient verbalizes understanding of leg bag teaching.

## 2017-09-07 NOTE — Progress Notes (Signed)
Patient ID: Peter Lam, male   DOB: 09-23-1946, 71 y.o.   MRN: [id number]  1 Day Post-Op Subjective: The patient is doing well.  No nausea or vomiting. Pain is adequately controlled.  Objective: Vital signs in last 24 hours: Temp:  [97.4 F (36.3 C)-100.4 F (38 C)] 100 F (37.8 C) (10/02 0440) Pulse Rate:  [81-106] 83 (10/02 0440) Resp:  [18-26] 19 (10/02 0440) BP: (125-157)/(67-92) 125/67 (10/02 0440) SpO2:  [90 %-100 %] 100 % (10/02 0440) Weight:  [67.6 kg (149 lb)-68.2 kg (150 lb 5.7 oz)] 68.2 kg (150 lb 5.7 oz) (10/01 2000)  Intake/Output from previous day: 10/01 0701 - 10/02 0700 In: 0017 [I.V.:2985; IV Piggyback:1100] Out: 4944 [Urine:1300; Drains:110; Blood:50] Intake/Output this shift: No intake/output data recorded.  Physical Exam:  General: Alert and oriented. CV: RRR Lungs: Clear bilaterally. GI: Soft, Nondistended. Incisions: Clean, dry, and intact Urine: Clear Extremities: Nontender, no erythema, no edema.  Lab Results:  Recent Labs  09/06/17 1542 09/07/17 0516  HGB 15.1 15.1  HCT 42.7 41.8      Assessment/Plan: POD# 1 s/p robotic prostatectomy.  1) SL IVF 2) Ambulate, Incentive spirometry 3) Transition to oral pain medication 4) Dulcolax suppository 5) D/C pelvic drain 6) Plan for likely discharge later today   Pryor Curia. MD   LOS: 0 days   Luiscarlos Kaczmarczyk,LES 09/07/2017, 7:47 AM

## 2017-09-07 NOTE — Care Management Note (Signed)
Case Management Note  Patient Details  Name: Peter Lam MRN: [id number] Date of Birth: [birth year]  Subjective/Objective: 71 y/o m admitted w/Prostate Ca. From home.                   Action/Plan:d/c home.   Expected Discharge Date:  09/07/17               Expected Discharge Plan:  Home/Self Care  In-House Referral:     Discharge planning Services  CM Consult  Post Acute Care Choice:    Choice offered to:     DME Arranged:    DME Agency:     HH Arranged:    HH Agency:     Status of Service:  Completed, signed off  If discussed at [employer] of Stay Meetings, dates discussed:    Additional Comments:  Dessa Phi, RN 09/07/2017, 11:42 AM

## 2017-09-07 NOTE — Plan of Care (Signed)
Problem: Pain Managment: Goal: General experience of comfort will improve Outcome: Progressing Pt denies pain

## 2017-10-04 DIAGNOSIS — M6281 Muscle weakness (generalized): Secondary | ICD-10-CM | POA: Diagnosis not present

## 2017-10-04 DIAGNOSIS — N393 Stress incontinence (female) (male): Secondary | ICD-10-CM | POA: Diagnosis not present

## 2017-10-05 ENCOUNTER — Other Ambulatory Visit (INDEPENDENT_AMBULATORY_CARE_PROVIDER_SITE_OTHER): Payer: Medicare Other

## 2017-10-05 ENCOUNTER — Encounter: Payer: Self-pay | Admitting: Nurse Practitioner

## 2017-10-05 ENCOUNTER — Ambulatory Visit: Payer: Medicare Other | Admitting: Family

## 2017-10-05 ENCOUNTER — Ambulatory Visit (INDEPENDENT_AMBULATORY_CARE_PROVIDER_SITE_OTHER): Payer: Medicare Other | Admitting: Nurse Practitioner

## 2017-10-05 VITALS — BP 140/90 | HR 111 | Temp 98.4°F | Resp 16 | Ht 70.0 in | Wt 138.9 lb

## 2017-10-05 DIAGNOSIS — I10 Essential (primary) hypertension: Secondary | ICD-10-CM | POA: Diagnosis not present

## 2017-10-05 DIAGNOSIS — Z23 Encounter for immunization: Secondary | ICD-10-CM

## 2017-10-05 DIAGNOSIS — R000 Tachycardia, unspecified: Secondary | ICD-10-CM | POA: Diagnosis not present

## 2017-10-05 LAB — BASIC METABOLIC PANEL
BUN: 13 mg/dL (ref 6–23)
CALCIUM: 9.7 mg/dL (ref 8.4–10.5)
CO2: 32 meq/L (ref 19–32)
Chloride: 98 mEq/L (ref 96–112)
Creatinine, Ser: 0.92 mg/dL (ref 0.40–1.50)
GFR: 104.82 mL/min (ref >60.00)
Glucose, Bld: 100 mg/dL — ABNORMAL HIGH (ref 70–99)
Potassium: 3.2 mEq/L — ABNORMAL LOW (ref 3.5–5.1)
SODIUM: 142 meq/L (ref 135–145)

## 2017-10-05 MED ORDER — ATENOLOL-CHLORTHALIDONE 50-25 MG PO TABS: 0.5000 | ORAL_TABLET | Freq: Every day | ORAL | 9 refills | Status: DC

## 2017-10-05 NOTE — Progress Notes (Signed)
Subjective:    Patient ID: Peter Lam, male    DOB: 09-May-1946, 71 y.o.   MRN: [id number]  HPI Mr. Molyneux is a 71yo male who presents today to establish care. He is transferring to me from another provider in the same clinic.  Hypertension-Maintained on tenoretic. Reports compliance but did not take medication this morning prior to appointment. [name] take as soon as he gets home. Denies headaches, vision changes, chest pain, shortness of breath, low heart rate. He prepares meals at home without added salt, fresh vegetable and avoids canned foods. He checks blood pressure about twice a week at home. Hes had low potassium in the past but declines oral potassium supplements due to unpleasant taste. he tries to eat a banana or other potassium rich foods daily.  BP Readings from Last 3 Encounters:  10/05/17 140/90  09/07/17 136/88  09/01/17 (!) 130/92   Tachycardia- elevated heart rate today. Denies fevers, syncope, weakness, chest pain, palpitations, shortness of breath. He did not take his tenoretic this am, He will take his daily dose as soon as he gets home. He reports poor fluid intake in the past 2 days, avoids due to discomfort of diuresis related to recent prostate surgery. He drank one caffeineated coke before his visit this morning. He is following with urology for care post-prostatectomy.  Review of Systems  See HPI  Past Medical History:  Diagnosis Date  . Arthritis   . BPH (benign prostatic hypertrophy) with urinary obstruction    followed by Dr. Jeffie Pollock at [facility]  . Cancer [facility])    Prostate cancer  . GERD (gastroesophageal reflux disease)   . Hx of adenomatous colonic polyps 05/07/2017  . Hypertension   . Inguinal hernia   . Prostate cancer [facility])      Social History   Social History  . Marital status: Widowed    Spouse name: [name]  . Number of children: 1  . Years of education: 80   Occupational History  . Retired     Social History Main Topics  .  Smoking status: Former Smoker    Packs/day: 1.00    Years: 20.00    Types: Cigarettes    Quit date: 12/07/1973  . Smokeless tobacco: Never Used  . Alcohol use No  . Drug use: No  . Sexual activity: Not Currently   Other Topics Concern  . 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   Fun/Hobby: Spends a lot of time at church (choir), reading     Past Surgical History:  Procedure Laterality Date  . INGUINAL HERNIA REPAIR  01/15/2012   Procedure: HERNIA REPAIR INGUINAL ADULT;  Surgeon: Earnstine Regal, MD;  Location: [place];  Service: General;  Laterality: Left;  Left inguinal hernia with mesh   . LYMPHADENECTOMY Bilateral 09/06/2017   Procedure: PELVIC LYMPHADENECTOMY;  Surgeon: Raynelle Bring, MD;  Location: [facility];  Service: Urology;  Laterality: Bilateral;  . MULTIPLE TOOTH EXTRACTIONS    . PROSTATE BIOPSY    . ROBOT ASSISTED LAPAROSCOPIC RADICAL PROSTATECTOMY N/A 09/06/2017   Procedure: XI ROBOTIC ASSISTED LAPAROSCOPIC RADICAL PROSTATECTOMY LEVEL 2;  Surgeon: Raynelle Bring, MD;  Location: [facility];  Service: Urology;  Laterality: N/A;  . TONSILLECTOMY AND ADENOIDECTOMY      Family History  Problem Relation Age of Onset  . Alzheimer's disease  Mother   . Hypertension Father   . Hypertension Brother   . Cancer Maternal Uncle        colon  . Colon cancer Maternal Uncle   . Hypertension Sister   . Throat cancer Other   . Cancer Maternal Uncle        throat  . Cancer Maternal Uncle        prostate    Allergies  Allergen Reactions  . Ace Inhibitors Other (See Comments)    Cough  . Diltiazem Hcl Other (See Comments)    Exacerbated acid reflux.    Current Outpatient Prescriptions on File Prior to Visit  Medication Sig Dispense Refill  . atenolol-chlorthalidone (TENORETIC) 50-25 MG tablet Take 0.5 tablets by mouth daily. 30 tablet 0  .  HYDROcodone-acetaminophen (NORCO) 5-325 MG tablet Take 1-2 tablets by mouth every 6 (six) hours as needed for moderate pain or severe pain. (Patient not taking: Reported on 10/05/2017) 30 tablet 0   No current facility-administered medications on file prior to visit.     BP 140/90 (BP Location: Left Arm, Patient Position: Sitting, Cuff Size: Normal)   Pulse (!) 111   Temp 98.4 F (36.9 C) (Oral)   Resp 16   Ht 5\' 10"  (1.778 m)   Wt 138 lb 14.4 oz (63 kg)   SpO2 97%   BMI 19.93 kg/m       Objective:   Physical Exam  Constitutional: He is oriented to person, place, and time. He appears well-developed and well-nourished.  Cardiovascular: Regular rhythm and intact distal pulses.   Elevated HR.  Pulmonary/Chest: Effort normal and breath sounds normal.  Musculoskeletal: Normal range of motion.  Neurological: He is alert and oriented to person, place, and time.  Skin: Skin is warm and dry.  Psychiatric: He has a normal mood and affect. Judgment and thought content normal.      Assessment & Plan:  Tdap, flu shot given today.  Encouraged to return for wellness visit at his convenience.  Tachycardia- elevated heart rate today. Denies fevers, syncope, weakness, chest pain, palpitations, shortness of breath. Suspect due to dehydration and missed daily dose of tenoretic. BMET ordered. He will take his tenoretic as soon as he gets home. Discussed importance of water intake and adequate hydration, patient verbalizes understanding and plans to increase water intake, decrease soda intake.

## 2017-10-05 NOTE — Patient Instructions (Addendum)
Head downstairs for labwork.  I have sent a refill of your blood pressure medication to your pharmacy for you. Please take your blood pressure medication as soon as you get home.  Remember to stay hydrated, about 8 glasses of water a day.  I'd like for you to schedule an annual wellness visit with Sharee Pimple at your convenience.  It was nice to meet you. Thanks for letting me take care of you today :)

## 2017-10-05 NOTE — Assessment & Plan Note (Signed)
Blood pressure reading 140/90 today. Managed on tenoretic. He reports compliance but did not take medication this am to avoid diuresis during office visit. He plans to take once home. He denies headache, vision changes, chest pain, shortness of breath. He reports low salt diet and routine monitoring of blood pressure at home. He has a history of hypokalemia, eats banana daily but refuses potassium supplements. Will recheck bmet today.

## 2017-10-06 ENCOUNTER — Other Ambulatory Visit: Payer: Self-pay | Admitting: Nurse Practitioner

## 2017-10-06 DIAGNOSIS — I10 Essential (primary) hypertension: Secondary | ICD-10-CM

## 2017-10-06 MED ORDER — ATENOLOL 50 MG PO TABS
50.0000 mg | ORAL_TABLET | Freq: Every day | ORAL | 3 refills | Status: DC
Start: 2017-10-06 — End: 2017-10-06

## 2017-10-06 MED ORDER — ATENOLOL 25 MG PO TABS: 25.0000 mg | ORAL_TABLET | Freq: Every day | ORAL | 3 refills | Status: DC

## 2017-11-08 ENCOUNTER — Ambulatory Visit: Payer: Medicare Other | Admitting: Nurse Practitioner

## 2017-11-10 ENCOUNTER — Encounter: Payer: Self-pay | Admitting: Internal Medicine

## 2017-11-25 ENCOUNTER — Encounter: Payer: Self-pay | Admitting: Internal Medicine

## 2017-12-03 DIAGNOSIS — C61 Malignant neoplasm of prostate: Secondary | ICD-10-CM | POA: Diagnosis not present

## 2017-12-10 DIAGNOSIS — C61 Malignant neoplasm of prostate: Secondary | ICD-10-CM | POA: Diagnosis not present

## 2017-12-10 DIAGNOSIS — N393 Stress incontinence (female) (male): Secondary | ICD-10-CM | POA: Diagnosis not present

## 2017-12-10 DIAGNOSIS — N5201 Erectile dysfunction due to arterial insufficiency: Secondary | ICD-10-CM | POA: Diagnosis not present

## 2017-12-10 DIAGNOSIS — N471 Phimosis: Secondary | ICD-10-CM | POA: Diagnosis not present

## 2017-12-15 ENCOUNTER — Telehealth: Payer: Self-pay | Admitting: Internal Medicine

## 2017-12-15 NOTE — Telephone Encounter (Signed)
Patient wants to delay f/u colonoscopy due to still recovering from prostate surgery  Will cancel colonoscopy appointments for now and replace recall for 03/2018 instead  Please inform sister Pamala Hurry) and/or patient  I spoke to [place]

## 2018-01-28 ENCOUNTER — Encounter: Payer: Medicare Other | Admitting: Internal Medicine

## 2018-02-16 ENCOUNTER — Ambulatory Visit (INDEPENDENT_AMBULATORY_CARE_PROVIDER_SITE_OTHER): Payer: Medicare Other | Admitting: Nurse Practitioner

## 2018-02-16 ENCOUNTER — Encounter: Payer: Self-pay | Admitting: Nurse Practitioner

## 2018-02-16 ENCOUNTER — Other Ambulatory Visit (INDEPENDENT_AMBULATORY_CARE_PROVIDER_SITE_OTHER): Payer: Medicare Other

## 2018-02-16 VITALS — BP 182/100 | HR 106 | Temp 98.2°F | Resp 16 | Ht 70.0 in | Wt 147.8 lb

## 2018-02-16 DIAGNOSIS — I10 Essential (primary) hypertension: Secondary | ICD-10-CM

## 2018-02-16 DIAGNOSIS — R000 Tachycardia, unspecified: Secondary | ICD-10-CM

## 2018-02-16 DIAGNOSIS — I4892 Unspecified atrial flutter: Secondary | ICD-10-CM | POA: Insufficient documentation

## 2018-02-16 LAB — TSH: TSH: 1.32 u[IU]/mL (ref 0.35–4.50)

## 2018-02-16 LAB — CBC
HEMATOCRIT: 49 % (ref 39.0–52.0)
Hemoglobin: 16.4 g/dL (ref 13.0–17.0)
MCHC: 33.5 g/dL (ref 30.0–36.0)
MCV: 93.2 fl (ref 78.0–100.0)
Platelets: 184 10*3/uL (ref 150.0–400.0)
RBC: 5.25 Mil/uL (ref 4.22–5.81)
RDW: 13.7 % (ref 11.5–15.5)
WBC: 9.4 10*3/uL (ref 4.0–10.5)

## 2018-02-16 LAB — COMPREHENSIVE METABOLIC PANEL
ALT: 20 U/L (ref 0–53)
AST: 17 U/L (ref 0–37)
Albumin: 4.4 g/dL (ref 3.5–5.2)
Alkaline Phosphatase: 93 U/L (ref 39–117)
BUN: 8 mg/dL (ref 6–23)
CHLORIDE: 102 meq/L (ref 96–112)
CO2: 30 mEq/L (ref 19–32)
CREATININE: 0.85 mg/dL (ref 0.40–1.50)
Calcium: 9.6 mg/dL (ref 8.4–10.5)
GFR: 114.72 mL/min (ref >60.00)
GLUCOSE: 92 mg/dL (ref 70–99)
Potassium: 3.8 mEq/L (ref 3.5–5.1)
SODIUM: 141 meq/L (ref 135–145)
Total Bilirubin: 0.5 mg/dL (ref 0.2–1.2)
Total Protein: 6.7 g/dL (ref 6.0–8.3)

## 2018-02-16 MED ORDER — METOPROLOL TARTRATE 25 MG PO TABS: 25.0000 mg | ORAL_TABLET | Freq: Two times a day (BID) | ORAL | 1 refills | Status: DC

## 2018-02-16 MED ORDER — ATENOLOL 25 MG PO TABS: 25.0000 mg | ORAL_TABLET | Freq: Every day | ORAL | 3 refills | Status: DC

## 2018-02-16 NOTE — Progress Notes (Signed)
Name: Peter Lam   MRN: [id number]    DOB: [birth year]   Date:02/16/2018       Progress Note  Subjective  Chief Complaint  Chief Complaint  Patient presents with  . Follow-up    blood pressure, has not had BP meds in a month    HPI Peter Lam returns today for a blood pressure follow up  Hypertension -maintained on atenolol 25 daily.  He was switched from atenolol-chlorthalidone combo to just atenolol after his last OV in October with labwork showing hypokalemia and patient not being willing to take a potassium supplement.  He was instructed to return in November for follow up of medication change but did not return until today. He did pick up the prescription for atenolol 25 daily after his last OV on 10/30 and took it for 3 months until it ran out - about one month ago- and he never called for a refill. He reports his blood pressure was in the 130s/upper 80s on occasional blood pressure check when taking the atenolol 25 daily. He says that he feels well today and denies any weakness, dizziness, headaches, vision changes, chest pain, palpitations, shortness of breath, edema.  BP Readings from Last 3 Encounters:  02/16/18 (!) 182/100  10/05/17 140/90  09/07/17 136/88     Patient Active Problem List   Diagnosis Date Noted  . Prostate cancer ([place]) 07/05/2017  . Hx of adenomatous colonic polyps 05/07/2017  . FH: diabetes mellitus 05/25/2014  . Health maintenance examination 03/16/2013  . Allergic dermatitis 07/18/2012  . PSA, INCREASED 02/27/2009  . Hyperlipidemia 08/30/2008  . HYPERTENSION, BENIGN ESSENTIAL, LABILE 08/12/2007  . BPH (benign prostatic hypertrophy) with urinary obstruction 08/12/2007    Past Surgical History:  Procedure Laterality Date  . INGUINAL HERNIA REPAIR  01/15/2012   Procedure: HERNIA REPAIR INGUINAL ADULT;  Surgeon: Earnstine Regal, MD;  Location: [place];  Service: General;  Laterality: Left;  Left inguinal hernia with mesh   .  LYMPHADENECTOMY Bilateral 09/06/2017   Procedure: PELVIC LYMPHADENECTOMY;  Surgeon: Raynelle Bring, MD;  Location[facility];  Service: Urology;  Laterality: Bilateral;  . MULTIPLE TOOTH EXTRACTIONS    . PROSTATE BIOPSY    . ROBOT ASSISTED LAPAROSCOPIC RADICAL PROSTATECTOMY N/A 09/06/2017   Procedure: XI ROBOTIC ASSISTED LAPAROSCOPIC RADICAL PROSTATECTOMY LEVEL 2;  Surgeon: Raynelle Bring, MD;  Location[facility];  Service: Urology;  Laterality: N/A;  . TONSILLECTOMY AND ADENOIDECTOMY      Family History  Problem Relation Age of Onset  . Alzheimer's disease Mother   . Hypertension Father   . Hypertension Brother   . Cancer Maternal Uncle        colon  . Colon cancer Maternal Uncle   . Hypertension Sister   . Throat cancer Other   . Cancer Maternal Uncle        throat  . Cancer Maternal Uncle        prostate    Social History   Socioeconomic History  . Marital status: Widowed    Spouse name: [name]  . Number of children: 1  . Years of education: 21  . Highest education level: Not on file  Social Needs  . Financial resource strain: Not on file  . Food insecurity - worry: Not on file  . Food insecurity - inability: Not on file  . Transportation needs - medical: Not on file  . Transportation needs - non-medical: Not on file  Occupational History  . Occupation: Retired  Tobacco Use  . Smoking status: Former Smoker    Packs/day: 1.00    Years: 20.00    Pack years: 20.00    Types: Cigarettes    Last attempt to quit: 12/07/1973    Years since quitting: 44.2  . Smokeless tobacco: Never Used  Substance and Sexual Activity  . Alcohol use: No  . Drug use: No  . Sexual activity: Not Currently  Other Topics Concern  . 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   Fun/Hobby: Spends a lot of time at church (choir), reading      Current Outpatient Medications:  .  atenolol  (TENORMIN) 25 MG tablet, Take 1 tablet (25 mg total) by mouth daily., Disp: 30 tablet, Rfl: 3  Allergies  Allergen Reactions  . Ace Inhibitors Other (See Comments)    Cough  . Diltiazem Hcl Other (See Comments)    Exacerbated acid reflux.     ROS See HPI  Objective  Vitals:   02/16/18 1305  BP: (!) 182/100  Pulse: (!) 106  Resp: 16  Temp: 98.2 F (36.8 C)  TempSrc: Oral  SpO2: 98%  Weight: 147 lb 12.8 oz (67 kg)  Height: 5\' 10"  (1.778 m)  EKG today  Body mass index is 21.21 kg/m.  Physical Exam Vital signs reviewed. Constitutional: Patient appears well-developed and well-nourished. No distress.  HENT: Head: Normocephalic and atraumatic  Nose: Nose normal. Mouth/Throat: Oropharynx is clear and moist. Eyes: Conjunctivae and EOM are normal No scleral icterus.  Neck: Normal range of motion. Neck supple. No thyromegaly present. No cervical adenopathy. Cardiovascular: Regular rhythm and normal heart sounds. No BLE edema. Pulmonary/Chest: Effort normal and breath sounds normal. No respiratory distress. Musculoskeletal: Normal range of motion, no joint effusions. No gross deformities Neurological: he is alert and oriented to person, place, and time. Coordination, balance, strength, speech and gait are normal.  Skin: Skin is warm and dry. No rash noted. No erythema.  Psychiatric: Patient has a normal mood and affect. behavior is normal.   Assessment & Plan RTC in 2 weeks for F/U of BP, metoprolol initiation  Tachycardia - EKG 12-Lead: I have personally reviewed the EKG tracing which reads atrial flutter, changed from last EKG on 9/26 showing sinus bradycardia- will refer urgently to cardiology for further evaluation and management - TSH; Future - CBC; Future - Comprehensive metabolic panel; Future

## 2018-02-16 NOTE — Patient Instructions (Addendum)
Please head downstairs for lab work.  Please start metoprolol tartrate 25 mg twice daily for your blood pressure and heart beat.  I have placed a referral to cardiology for your abnormal heart beat. Our office will call you to schedule this appointment. You should hear from our office in 7-10 days.  Please follow up in 2 weeks with me with a log of your blood pressure readings- try to check your blood pressure once daily or at least a few times a week, at the same time each day, and keep a log. We need to get your blood pressure under 140/90.  Please let me know in the future if you ever need refills of your medications, you should take your blood pressure medication every day and I do not want you to go without it.   Atrial Flutter Atrial flutter is a type of abnormal heart rhythm (arrhythmia). In atrial flutter, the heartbeat is fast but regular. There are two types of atrial flutter:  Paroxysmal atrial flutter. This type starts suddenly. It usually stops on its own soon after it starts.  Permanent atrial flutter. This type does not go away.  What are the causes? This condition may be caused by:  A heart condition or problem, such as: ? A heart attack. ? Heart failure. ? A heart valve problem.  A lung problem, such as: ? A blood clot in the lungs (pulmonary embolism, or PE). ? Chronic obstructive pulmonary disease.  Poorly controlled high blood pressure (hypertension).  Hyperthyroidism.  Caffeine.  Some decongestant cold medicines.  Low levels of minerals called electrolytes in the blood.  Cocaine.  What increases the risk? This condition is more likely to develop in:  Elderly adults.  Men.  What are the signs or symptoms? Symptoms of this condition include:  A feeling that your heart is pounding or racing (palpitations).  Shortness of breath.  Chest pain.  Feeling light-headed.  Dizziness.  Fainting.  How is this diagnosed? This condition may be  diagnosed with tests, including:  An electrocardiogram (ECG). This is a painless test that records electrical signals in the heart.  Holter monitoring. For this test, you wear a device that records your heartbeat for 1-2 days.  Cardiac event monitoring. For this test, you wear a device that records your heartbeat for up to 30 days.  An echocardiogram. This is a painless test that uses sound waves to make a picture of your heart.  Stress test. This test records your heartbeat while you exercise.  Blood tests.  How is this treated? This condition may be treated with:  Treatment of any underlying conditions.  Medicine to make your heart beat more slowly.  Medicine to keep the condition from coming back.  A procedure to keep the condition under control. Some procedures to do this include: ? Cardioversion. During this procedure, medicines or an electrical shock are given to make the heart beat normally. ? Ablation. During this procedure, the heart tissue that is causing the problem is destroyed. This procedure may be done if atrial flutter lasts a long time or happens often.  Follow these instructions at home:  Take over-the-counter and prescription medicines only as told by your health care provider.  Do not take any new medicines without talking to your health care provider.  Do not use tobacco products, including cigarettes, chewing tobacco, or e-cigarettes. If you need help quitting, ask your health care provider.  Limit alcohol intake to no more than 1 drink per day  for nonpregnant women and 2 drinks per day for men. One drink equals 12 oz of beer, 5 oz of wine, or 1 oz of hard liquor.  Try to reduce any stress. Stress can make your symptoms worse. Contact a health care provider if:  Your symptoms get worse. Get help right away if:  You are dizzy.  You feel like fainting or you faint.  You have shortness of breath.  You feel pain or pressure in your chest.  You  suddenly feel nauseous or you suddenly vomit.  There is a sudden change in your ability to speak, eat, or move.  You are sweating a lot for no reason. This information is not intended to replace advice given to you by your health care provider. Make sure you discuss any questions you have with your health care provider. Document Released: 04/11/2009 Document Revised: 04/01/2016 Document Reviewed: 06/07/2015 Elsevier Interactive Patient Education  [employer].

## 2018-02-17 ENCOUNTER — Encounter: Payer: Self-pay | Admitting: Nurse Practitioner

## 2018-02-17 NOTE — Assessment & Plan Note (Addendum)
He is asymptomatic. Regular heart rhythm auscultated on PE but pulse is elevated  and EKG reads atrial flutter.  We discussed starting metoprolol today for hypertension and rate control- dosing and side effects were discussed. Return precautions including calling 911 for chest pain or shortness of breath were discussed -See AVS for additional information provided to patient - Ambulatory referral to Cardiology - metoprolol tartrate (LOPRESSOR) 25 MG tablet; Take 1 tablet (25 mg total) by mouth 2 (two) times daily.  Dispense: 60 tablet; Refill: 1

## 2018-02-17 NOTE — Assessment & Plan Note (Signed)
Blood pressure is quite elevated and he is in A-flutter on EKG Will start metoprolol 25 BID for hypertension and rate control We discussed the importance of daily medication compliance in the management of HTN and BP goal of 140/90- he brought his home blood pressure monitor to visit today and it was calibrated for accuracy  He will return in 2 weeks for follow up of blood pressure - TSH; Future - CBC; Future - Comprehensive metabolic panel; Future - metoprolol tartrate (LOPRESSOR) 25 MG tablet; Take 1 tablet (25 mg total) by mouth 2 (two) times daily.  Dispense: 60 tablet; Refill: 1

## 2018-03-15 ENCOUNTER — Ambulatory Visit (INDEPENDENT_AMBULATORY_CARE_PROVIDER_SITE_OTHER): Payer: Medicare Other | Admitting: Physician Assistant

## 2018-03-15 ENCOUNTER — Encounter: Payer: Self-pay | Admitting: Physician Assistant

## 2018-03-15 VITALS — BP 142/98 | HR 78 | Ht 70.0 in | Wt 153.1 lb

## 2018-03-15 DIAGNOSIS — I4892 Unspecified atrial flutter: Secondary | ICD-10-CM

## 2018-03-15 DIAGNOSIS — I10 Essential (primary) hypertension: Secondary | ICD-10-CM | POA: Diagnosis not present

## 2018-03-15 DIAGNOSIS — R000 Tachycardia, unspecified: Secondary | ICD-10-CM | POA: Diagnosis not present

## 2018-03-15 MED ORDER — METOPROLOL TARTRATE 50 MG PO TABS: 50.0000 mg | ORAL_TABLET | Freq: Two times a day (BID) | ORAL | 5 refills | Status: DC

## 2018-03-15 MED ORDER — METOPROLOL TARTRATE 50 MG PO TABS: 25.0000 mg | ORAL_TABLET | Freq: Two times a day (BID) | ORAL | 5 refills | Status: DC

## 2018-03-15 NOTE — Patient Instructions (Addendum)
Medication Instructions:    START TAKING  METOPROLOL  50 MG  TWICE A DAY      If you need a refill on your cardiac medications before your next appointment, please call your pharmacy.  Labwork:  NONE ORDERED  TODAY    Testing/Procedures:  NONE ORDERED  TODAY    Follow-Up:  CONTACT [facility] 336 [phone] AS NEEDED FOR  ANY CARDIAC RELATED SYMPTOMS    Any Other Special Instructions Will Be Listed Below (If Applicable).

## 2018-03-15 NOTE — Progress Notes (Signed)
Cardiology Office Note    Date:  03/15/2018   ID:  Peter Lam, DOB May 19, 1946, MRN [id number]  PCP:  Lance Sell, NP  Cardiologist:  New to Dr. Meda Coffee   Chief Complaint: Atrial flutter  History of Present Illness:   Peter Lam is a 72 y.o. male with hx of HTN and prostate cancer referred by Peter Chestnut, NP for atrial flutter.   Seen by PCP 02/16/18 for routine BP follow up. EKG showed tachycardia at rate of 100 bpm that concerning for atrial flutter.   Here today for further discussion. He denies any palpitations, chest pain, SOB, dizziness, orthopnea, PND, lower extremity edema, or syncope.  Review of PCP EKG shows sinus tachycardia.  No atrial fibrillation/flutter.  Prior smoker quit many years ago.  Not regular exercise but denies any exertional chest pain or shortness of breath.  Denies alcohol or illicit drug use.  Mother had at MI in her 62s.  Maternal grandmother also has history of MI.   Past Medical History:  Diagnosis Date  . Arthritis   . BPH (benign prostatic hypertrophy) with urinary obstruction    followed by Dr. Jeffie Pollock at [facility]  . Cancer [facility])    Prostate cancer  . GERD (gastroesophageal reflux disease)   . Hx of adenomatous colonic polyps 05/07/2017  . Hypertension   . Inguinal hernia   . Prostate cancer [facility])     Past Surgical History:  Procedure Laterality Date  . INGUINAL HERNIA REPAIR  01/15/2012   Procedure: HERNIA REPAIR INGUINAL ADULT;  Surgeon: Earnstine Regal, MD;  Location: [place];  Service: General;  Laterality: Left;  Left inguinal hernia with mesh   . LYMPHADENECTOMY Bilateral 09/06/2017   Procedure: PELVIC LYMPHADENECTOMY;  Surgeon: Raynelle Bring, MD;  Location[facility];  Service: Urology;  Laterality: Bilateral;  . MULTIPLE TOOTH EXTRACTIONS    . PROSTATE BIOPSY    . ROBOT ASSISTED LAPAROSCOPIC RADICAL PROSTATECTOMY N/A 09/06/2017   Procedure: XI ROBOTIC ASSISTED LAPAROSCOPIC RADICAL PROSTATECTOMY  LEVEL 2;  Surgeon: Raynelle Bring, MD;  Location: [facility];  Service: Urology;  Laterality: N/A;  . TONSILLECTOMY AND ADENOIDECTOMY      Current Medications: Prior to Admission medications   Medication Sig Start Date End Date Taking? Authorizing Provider  metoprolol tartrate (LOPRESSOR) 25 MG tablet Take 1 tablet (25 mg total) by mouth 2 (two) times daily. 02/16/18   Lance Sell, NP    Allergies:   Ace inhibitors and Diltiazem hcl   Social History   Socioeconomic History  . Marital status: Widowed    Spouse name: [name]  . Number of children: 1  . Years of education: 45  . Highest education level: Not on file  Occupational History  . Occupation: Retired   Scientific laboratory technician  . Financial resource strain: Not on file  . Food insecurity:    Worry: Not on file    Inability: Not on file  . Transportation needs:    Medical: Not on file    Non-medical: Not on file  Tobacco Use  . Smoking status: Former Smoker    Packs/day: 1.00    Years: 20.00    Pack years: 20.00    Types: Cigarettes    Last attempt to quit: 12/07/1973    Years since quitting: 44.2  . Smokeless tobacco: Never Used  Substance and Sexual Activity  . Alcohol use: No  . Drug use: No  . Sexual activity: Not Currently  Lifestyle  .  Physical activity:    Days per week: Not on file    Minutes per session: Not on file  . Stress: Not on file  Relationships  . Social connections:    Talks on phone: Not on file    Gets together: Not on file    Attends religious service: Not on file    Active member of club or organization: Not on file    Attends meetings of clubs or organizations: Not on file    Relationship status: Not on file  Other Topics Concern  . 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   Fun/Hobby: Spends a lot of time at church (choir), reading      Family History:  The patient's family  history includes Alzheimer's disease in his mother; Cancer in his maternal uncle, maternal uncle, and maternal uncle; Colon cancer in his maternal uncle; Hypertension in his brother, father, and sister; Throat cancer in his other.   ROS:   Please see the history of present illness.    ROS All other systems reviewed and are negative.   PHYSICAL EXAM:   VS:  BP (!) 142/98   Pulse 78   Ht 5\' 10"  (1.778 m)   Wt 153 lb 1.9 oz (69.5 kg)   SpO2 98%   BMI 21.97 kg/m    GEN: Well nourished, well developed, in no acute distress  HEENT: normal  Neck: no JVD, carotid bruits, or masses Cardiac: RRR; no murmurs, rubs, or gallops,no edema  Respiratory:  clear to auscultation bilaterally, normal work of breathing GI: soft, nontender, nondistended, + BS MS: no deformity or atrophy  Skin: warm and dry, no rash Neuro:  Alert and Oriented x 3, Strength and sensation are intact Psych: euthymic mood, full affect  Wt Readings from Last 3 Encounters:  03/15/18 153 lb 1.9 oz (69.5 kg)  02/16/18 147 lb 12.8 oz (67 kg)  10/05/17 138 lb 14.4 oz (63 kg)      Studies/Labs Reviewed:   EKG:  EKG is ordered today.  The ekg ordered today demonstrates sinus rhythm at rate of 75 bpm  Recent Labs: 02/16/2018: ALT 20; BUN 8; Creatinine, Ser 0.85; Hemoglobin 16.4; Platelets 184.0; Potassium 3.8; Sodium 141; TSH 1.32   Lipid Panel    Component Value Date/Time   CHOL 172 06/12/2015 0859   TRIG 58 06/12/2015 0859   HDL 58 06/12/2015 0859   CHOLHDL 3.0 06/12/2015 0859   VLDL 12 06/12/2015 0859   LDLCALC 102 (H) 06/12/2015 0859   LDLDIRECT 109 05/15/2016 0906    Additional studies/ records that were reviewed today include:   As above    ASSESSMENT & PLAN:    1. Tachycardia Personally review of EKG from PCP office showed sinus tach at rate of 100 bpm.  No arrhythmia noted.  Patient denies any palpitation, shortness of breath, dizziness or syncope.  He was started on metoprolol with improved rate to  70s.  EKG today shows sinus rhythm.  No further workup needed.  2.  Hypertension -Elevated blood pressure.  Will uptitrate metoprolol to 50 mg twice daily.  Reviewed with DOD Dr. Meda Coffee.    Medication Adjustments/Labs and Tests Ordered: Current medicines are reviewed at length with the patient today.  Concerns regarding medicines are outlined above.  Medication changes, Labs and Tests ordered today are listed in the Patient Instructions  below. Patient Instructions  Medication Instructions:    START TAKING  METOPROLOL  50 MG  TWICE A DAY      If you need a refill on your cardiac medications before your next appointment, please call your pharmacy.  Labwork:  NONE ORDERED  TODAY    Testing/Procedures:  NONE ORDERED  TODAY    Follow-Up:  CONTACT [facility] [phone] AS NEEDED FOR  ANY CARDIAC RELATED SYMPTOMS    Any Other Special Instructions Will Be Listed Below (If Applicable).                                                                                                                                                      Jarrett Soho, [place]  03/15/2018 9:57 AM    [facility] [address], [place], [place]  [address] Phone: [phone]; Fax: [phone]

## 2018-03-24 ENCOUNTER — Ambulatory Visit (INDEPENDENT_AMBULATORY_CARE_PROVIDER_SITE_OTHER): Payer: Medicare Other | Admitting: Nurse Practitioner

## 2018-03-24 ENCOUNTER — Encounter: Payer: Self-pay | Admitting: Nurse Practitioner

## 2018-03-24 VITALS — BP 144/94 | HR 70 | Temp 98.2°F | Resp 16 | Ht 70.0 in | Wt 152.0 lb

## 2018-03-24 DIAGNOSIS — I10 Essential (primary) hypertension: Secondary | ICD-10-CM | POA: Diagnosis not present

## 2018-03-24 NOTE — Progress Notes (Signed)
Name: Peter Lam   MRN: [id number]    DOB: 1946-03-28   Date:03/24/2018       Progress Note  Subjective  Chief Complaint  Chief Complaint  Patient presents with  . Follow-up    blood pressure    HPI  Hypertension -maintained on metoprolol tartrate which was started at 25BID on his last OV with me and increased to 50BID last week by cardiology. He says he has increased his metoprolol dosage to 50BID and tolerating well, denies any adverse effects. Reports routinely checking his BP readings at home-153/75 yesterday. Denies headaches, vision changes, chest pain, shortness of breath, edema. He also says he is also trying to be more active and has been exercising a few days a week. He tells me today that he is very stressed due to recent family death and that could be causing an increase In his blood pressure.  BP Readings from Last 3 Encounters:  03/24/18 (!) 144/94  03/15/18 (!) 142/98  02/16/18 (!) 182/100    Patient Active Problem List   Diagnosis Date Noted  . Atrial flutter ([place]) 02/16/2018  . Prostate cancer ([place]) 07/05/2017  . Hx of adenomatous colonic polyps 05/07/2017  . FH: diabetes mellitus 05/25/2014  . Health maintenance examination 03/16/2013  . PSA, INCREASED 02/27/2009  . Hyperlipidemia 08/30/2008  . HYPERTENSION, BENIGN ESSENTIAL, LABILE 08/12/2007  . BPH (benign prostatic hypertrophy) with urinary obstruction 08/12/2007    Past Surgical History:  Procedure Laterality Date  . INGUINAL HERNIA REPAIR  01/15/2012   Procedure: HERNIA REPAIR INGUINAL ADULT;  Surgeon: Earnstine Regal, MD;  Location: [place];  Service: General;  Laterality: Left;  Left inguinal hernia with mesh   . LYMPHADENECTOMY Bilateral 09/06/2017   Procedure: PELVIC LYMPHADENECTOMY;  Surgeon: Raynelle Bring, MD;  [facility];  Service: Urology;  Laterality: Bilateral;  . MULTIPLE TOOTH EXTRACTIONS    . PROSTATE BIOPSY    . ROBOT ASSISTED LAPAROSCOPIC RADICAL  PROSTATECTOMY N/A 09/06/2017   Procedure: XI ROBOTIC ASSISTED LAPAROSCOPIC RADICAL PROSTATECTOMY LEVEL 2;  Surgeon: Raynelle Bring, MD;  [facility];  Service: Urology;  Laterality: N/A;  . TONSILLECTOMY AND ADENOIDECTOMY      Family History  Problem Relation Age of Onset  . Alzheimer's disease Mother   . Hypertension Father   . Hypertension Brother   . Cancer Maternal Uncle        colon  . Colon cancer Maternal Uncle   . Hypertension Sister   . Throat cancer Other   . Cancer Maternal Uncle        throat  . Cancer Maternal Uncle        prostate    Social History   Socioeconomic History  . Marital status: Widowed    Spouse name: [name]  . Number of children: 1  . Years of education: 35  . Highest education level: Not on file  Occupational History  . Occupation: Retired   Scientific laboratory technician  . Financial resource strain: Not on file  . Food insecurity:    Worry: Not on file    Inability: Not on file  . Transportation needs:    Medical: Not on file    Non-medical: Not on file  Tobacco Use  . Smoking status: Former Smoker    Packs/day: 1.00    Years: 20.00    Pack years: 20.00    Types: Cigarettes    Last attempt to quit: 12/07/1973    Years since quitting: 44.3  . Smokeless  tobacco: Never Used  Substance and Sexual Activity  . Alcohol use: No  . Drug use: No  . Sexual activity: Not Currently  Lifestyle  . Physical activity:    Days per week: Not on file    Minutes per session: Not on file  . Stress: Not on file  Relationships  . Social connections:    Talks on phone: Not on file    Gets together: Not on file    Attends religious service: Not on file    Active member of club or organization: Not on file    Attends meetings of clubs or organizations: Not on file    Relationship status: Not on file  . Intimate partner violence:    Fear of current or ex partner: Not on file    Emotionally abused: Not on file    Physically abused: Not on file    Forced  sexual activity: Not on file  Other Topics Concern  . 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   Fun/Hobby: Spends a lot of time at church (choir), reading      Current Outpatient Medications:  .  metoprolol tartrate (LOPRESSOR) 50 MG tablet, Take 1 tablet (50 mg total) by mouth 2 (two) times daily., Disp: 60 tablet, Rfl: 5  Allergies  Allergen Reactions  . Ace Inhibitors Other (See Comments)    Cough  . Diltiazem Hcl Other (See Comments)    Exacerbated acid reflux.     ROS See HPI  Objective  Vitals:   03/24/18 1420  BP: (!) 144/94  Pulse: 70  Resp: 16  Temp: 98.2 F (36.8 C)  TempSrc: Oral  SpO2: 98%  Weight: 152 lb (68.9 kg)  Height: 5\' 10"  (1.778 m)    Body mass index is 21.81 kg/m.  Physical Exam Vital signs reviewed. Constitutional: Patient appears well-developed and well-nourished. No distress.  HENT: Head: Normocephalic and atraumatic  Nose: Nose normal. Mouth/Throat: Oropharynx is clear and moist. Eyes: Conjunctivae and EOM are normal No scleral icterus.  Neck: Normal range of motion. Neck supple. Cardiovascular: Regular rate and rhythm and normal heart sounds. No BLE edema. Distal pulses intact Pulmonary/Chest: Effort normal and breath sounds normal. No respiratory distress. Neurological: he is alert and oriented to person, place, and time. Coordination, balance, strength, speech and gait are normal.  Skin: Skin is warm and dry. No rash noted. No erythema.  Psychiatric: Patient has a normal mood and affect. behavior is normal.    Assessment & Plan RTC in 1 month for F/U: HTN, health maintenance

## 2018-03-24 NOTE — Assessment & Plan Note (Signed)
Continue Metoprolol dosage as adjusted by cardiology last week Patient will continue to monitor BP readings at home Discussed home management of HTN and return precautions and printed on AVS Return in 1 month for F/U

## 2018-03-24 NOTE — Patient Instructions (Addendum)
Please try to check your blood pressure once daily or at least a few times a week, at the same time each day, and keep a log. Your blood pressure goal is below 140/90  Return to clinic in 1 month for follow up. We can update your cholesterol labwork at next visit   Hypertension Hypertension is another name for high blood pressure. High blood pressure forces your heart to work harder to pump blood. This can cause problems over time. There are two numbers in a blood pressure reading. There is a top number (systolic) over a bottom number (diastolic). It is best to have a blood pressure below 120/80. Healthy choices can help lower your blood pressure. You may need medicine to help lower your blood pressure if:  Your blood pressure cannot be lowered with healthy choices.  Your blood pressure is higher than 130/80.  Follow these instructions at home: Eating and drinking  If directed, follow the DASH eating plan. This diet includes: ? Filling half of your plate at each meal with fruits and vegetables. ? Filling one quarter of your plate at each meal with whole grains. Whole grains include whole wheat pasta, brown rice, and whole grain bread. ? Eating or drinking low-fat dairy products, such as skim milk or low-fat yogurt. ? Filling one quarter of your plate at each meal with low-fat (lean) proteins. Low-fat proteins include fish, skinless chicken, eggs, beans, and tofu. ? Avoiding fatty meat, cured and processed meat, or chicken with skin. ? Avoiding premade or processed food.  Eat less than 1,500 mg of salt (sodium) a day.  Limit alcohol use to no more than 1 drink a day for nonpregnant women and 2 drinks a day for men. One drink equals 12 oz of beer, 5 oz of wine, or 1 oz of hard liquor. Lifestyle  Work with your doctor to stay at a healthy weight or to lose weight. Ask your doctor what the best weight is for you.  Get at least 30 minutes of exercise that causes your heart to beat faster  (aerobic exercise) most days of the week. This may include walking, swimming, or biking.  Get at least 30 minutes of exercise that strengthens your muscles (resistance exercise) at least 3 days a week. This may include lifting weights or pilates.  Do not use any products that contain nicotine or tobacco. This includes cigarettes and e-cigarettes. If you need help quitting, ask your doctor.  Check your blood pressure at home as told by your doctor.  Keep all follow-up visits as told by your doctor. This is important. Medicines  Take over-the-counter and prescription medicines only as told by your doctor. Follow directions carefully.  Do not skip doses of blood pressure medicine. The medicine does not work as well if you skip doses. Skipping doses also puts you at risk for problems.  Ask your doctor about side effects or reactions to medicines that you should watch for. Contact a doctor if:  You think you are having a reaction to the medicine you are taking.  You have headaches that keep coming back (recurring).  You feel dizzy.  You have swelling in your ankles.  You have trouble with your vision. Get help right away if:  You get a very bad headache.  You start to feel confused.  You feel weak or numb.  You feel faint.  You get very bad pain in your: ? Chest. ? Belly (abdomen).  You throw up (vomit) more than once.  You have trouble breathing. Summary  Hypertension is another name for high blood pressure.  Making healthy choices can help lower blood pressure. If your blood pressure cannot be controlled with healthy choices, you may need to take medicine. This information is not intended to replace advice given to you by your health care provider. Make sure you discuss any questions you have with your health care provider. Document Released: 05/11/2008 Document Revised: 10/21/2016 Document Reviewed: 10/21/2016 Elsevier Interactive Patient Education  [employer].

## 2018-04-19 ENCOUNTER — Telehealth: Payer: Self-pay | Admitting: Emergency Medicine

## 2018-04-19 NOTE — Telephone Encounter (Signed)
Called patient to schedule AWV. Patient declined at this time due to some medical issues.

## 2018-08-21 IMAGING — MR MR PROSTATE WO/W CM
23 of 56 series · 23 of 56 positions shown · IV contrast (yes)
Comparison: Biopsy results of 03/13/2009.

CLINICAL DATA: Elevated PSA. History of benign prostatic
hyperplasia.

EXAM:
MR PROSTATE WITHOUT AND WITH CONTRAST
TECHNIQUE: Multiplanar multisequence MRI images were obtained of the pelvis
centered about the prostate. Pre and post contrast images were
obtained.
CONTRAST:  14mL MULTIHANCE GADOBENATE DIMEGLUMINE 529 MG/ML IV SOLN

[Series 3: bSSFP fat-sat · axial · 6.0mm · 0.86mm/px · 1 of 44 slices shown]
[im 1/44]
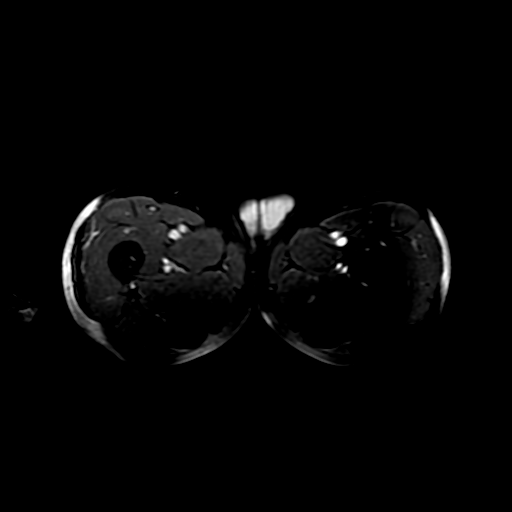

[Series 4: T1 · axial · 6.0mm · 0.86mm/px · 1 of 41 slices shown (1 of 2)]
[im 1/41]
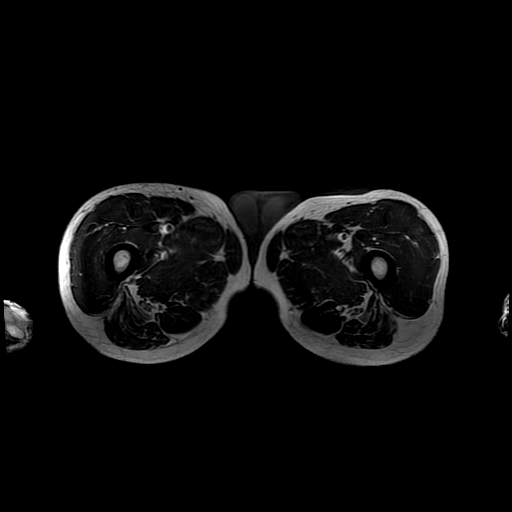

[Series 6: T2 · axial · 3.0mm · 0.29mm/px · 1 of 28 slices shown (1 of 4)]
[im 1/28]
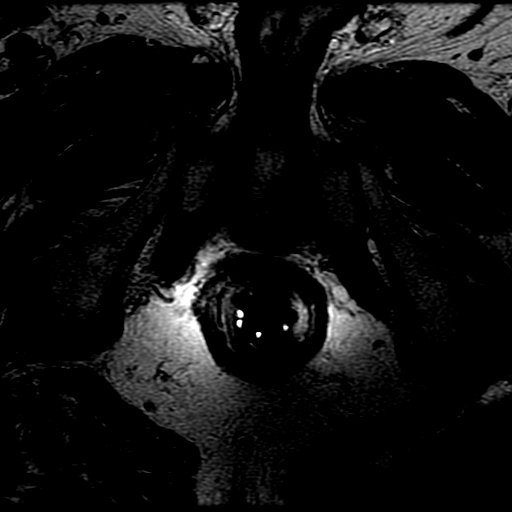

[Series 7: T1 · axial · 3.0mm · 0.29mm/px · 1 of 28 slices shown (2 of 2)]
[im 1/28]
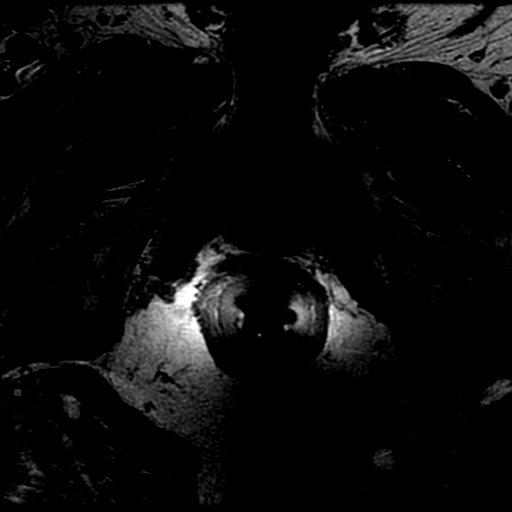

[Series 8: T2 · axial · 1.8mm · 0.47mm/px · 1 of 156 slices shown (2 of 4)]
[im 1/156]
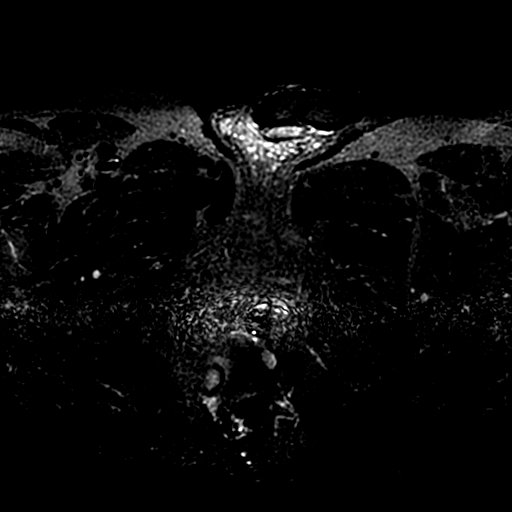

[Series 9: T2 · sagittal · 4.0mm · 0.29mm/px · 1 of 24 slices shown (3 of 4)]
[im 1/24]
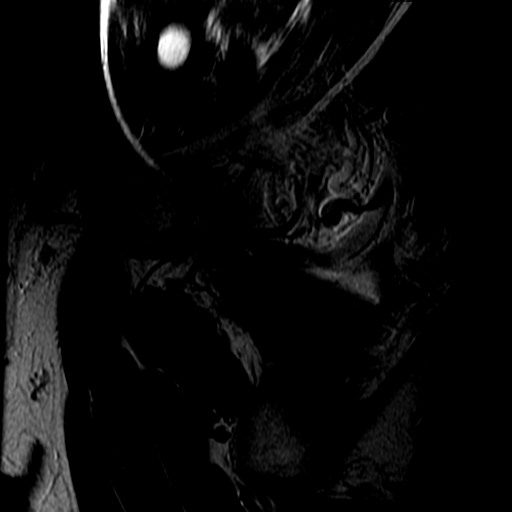

[Series 10: T2 · coronal · 4.0mm · 0.29mm/px · 1 of 24 slices shown (4 of 4)]
[im 1/24]
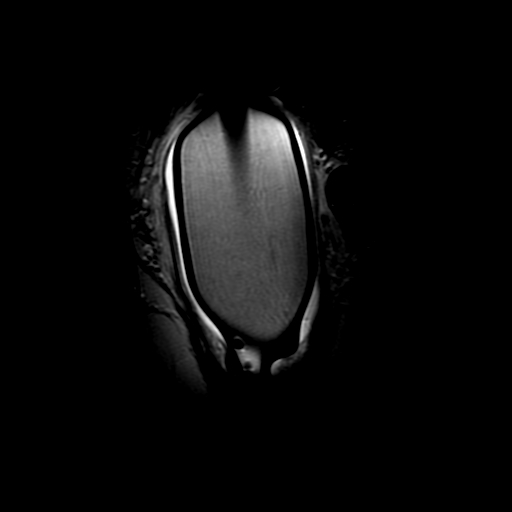

[Series 11: DWI · axial · 3.0mm · 0.59mm/px · 1 of 56 slices shown (1 of 6)]
[im 1/56]
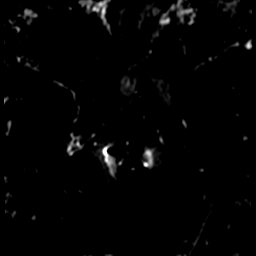

[Series 12: DWI · axial · 3.0mm · 0.59mm/px · 1 of 56 slices shown (2 of 6)]
[im 1/56]
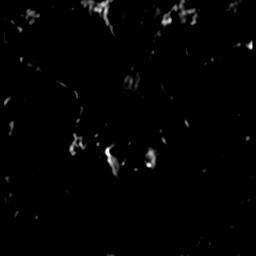

[Series 13: DWI · axial · 3.0mm · 0.59mm/px · 1 of 56 slices shown (3 of 6)]
[im 1/56]
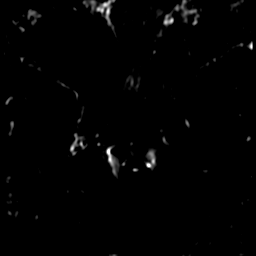

[Series 800: reformatted · coronal · 2.0mm · 0.39mm/px · 1 of 48 slices shown (1 of 2)]
[im 1/48]
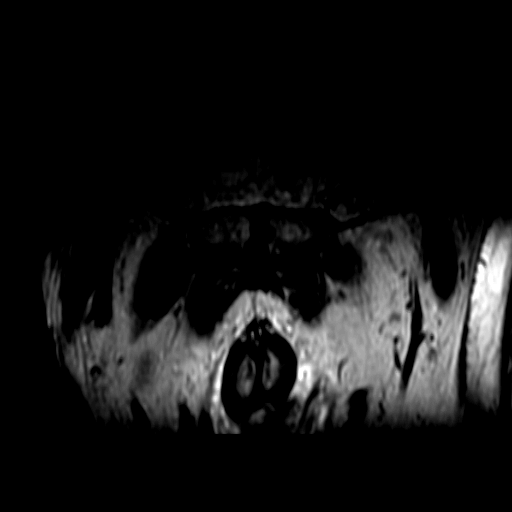

[Series 801: reformatted · axial · 1.8mm · 0.47mm/px · 1 of 57 slices shown (2 of 2)]
[im 1/57]
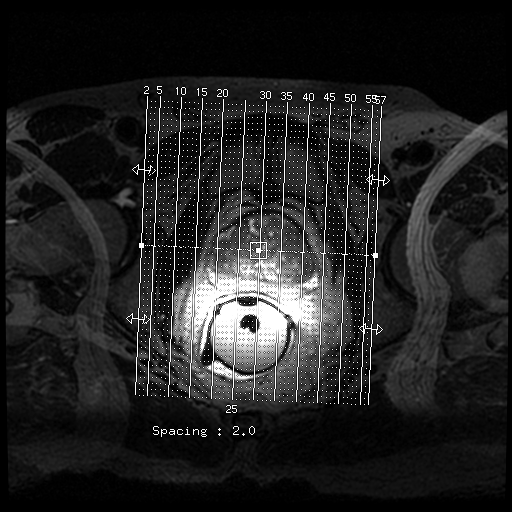

[Series 1100: DWI · axial · 3.0mm · 0.59mm/px · 1 of 28 slices shown (4 of 6)]
[im 1/28]
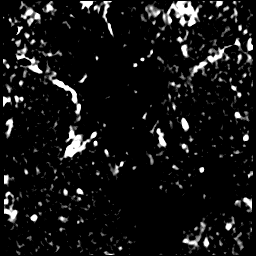

[Series 1200: DWI · axial · 3.0mm · 0.59mm/px · 1 of 28 slices shown (5 of 6)]
[im 1/28]
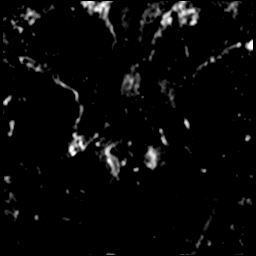

[Series 1300: DWI · axial · 3.0mm · 0.59mm/px · 1 of 28 slices shown (6 of 6)]
[im 1/28]
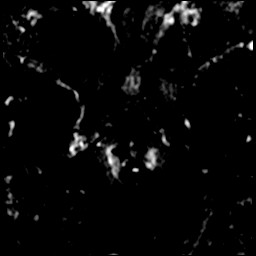

[((id)/(id)/1)-((id)/(id)/1) · axial · 3.0mm · 0.43mm/px · 1 of 76 slices shown (1 of 8)]
[im 1/76]
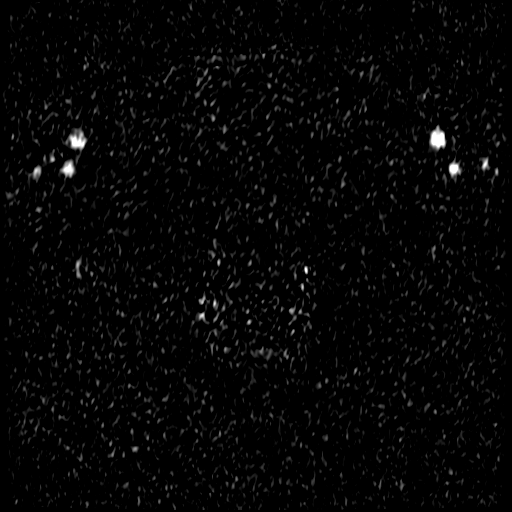

[((id)/(id)/1)-((id)/(id)/1) · axial · 3.0mm · 0.43mm/px · 1 of 76 slices shown (2 of 8)]
[im 1/76]
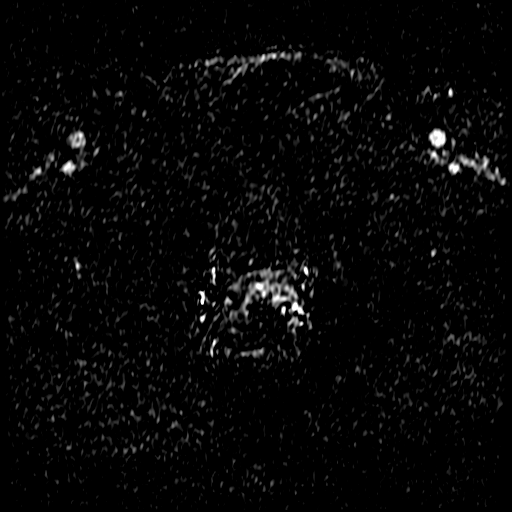

[((id)/(id)/1)-((id)/(id)/1) · axial · 3.0mm · 0.43mm/px · 1 of 76 slices shown (3 of 8)]
[im 1/76]
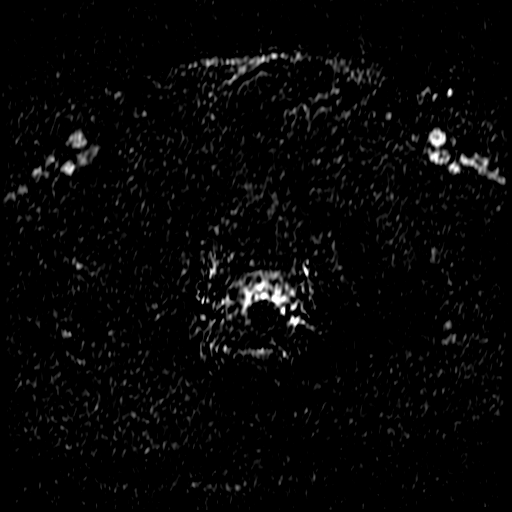

[((id)/(id)/1)-((id)/(id)/1) · axial · 3.0mm · 0.43mm/px · 1 of 76 slices shown (4 of 8)]
[im 1/76]
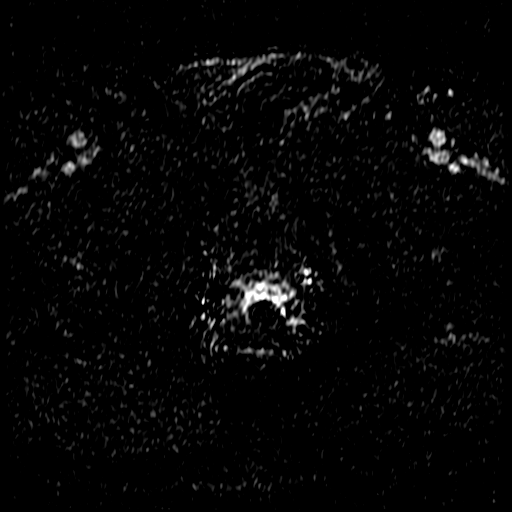

[((id)/(id)/1)-((id)/(id)/1) · axial · 3.0mm · 0.43mm/px · 1 of 76 slices shown (5 of 8)]
[im 1/76]
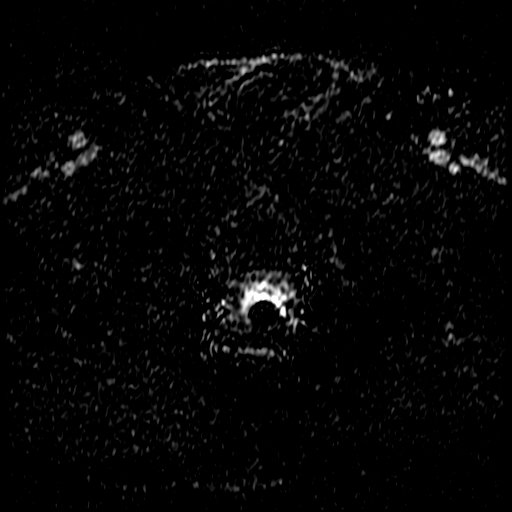

[((id)/(id)/1)-((id)/(id)/1) · axial · 3.0mm · 0.43mm/px · 1 of 76 slices shown (6 of 8)]
[im 1/76]
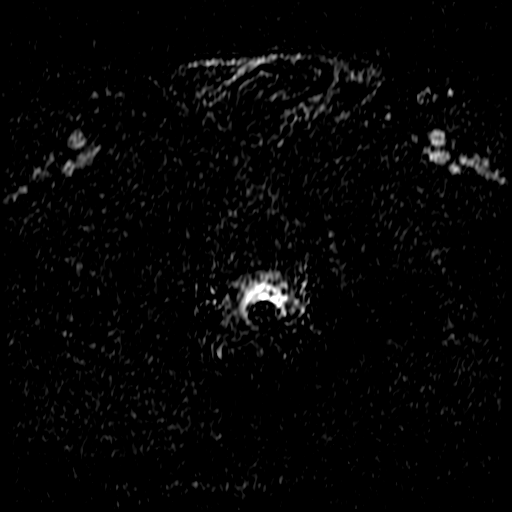

[((id)/(id)/1)-((id)/(id)/1) · axial · 3.0mm · 0.43mm/px · 1 of 76 slices shown (7 of 8)]
[im 1/76]
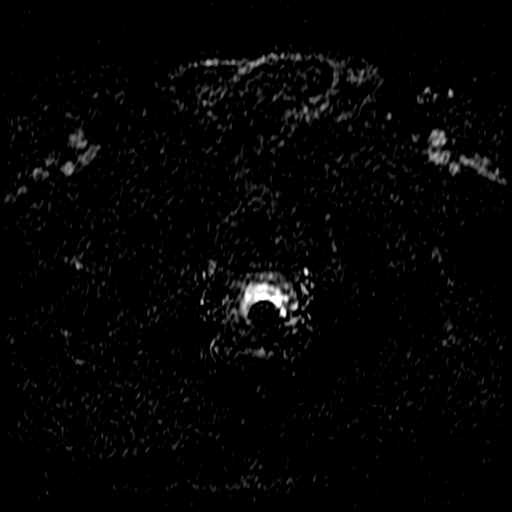

[((id)/(id)/1)-((id)/(id)/1) · axial · 3.0mm · 0.43mm/px · 1 of 76 slices shown (8 of 8)]
[im 1/76]
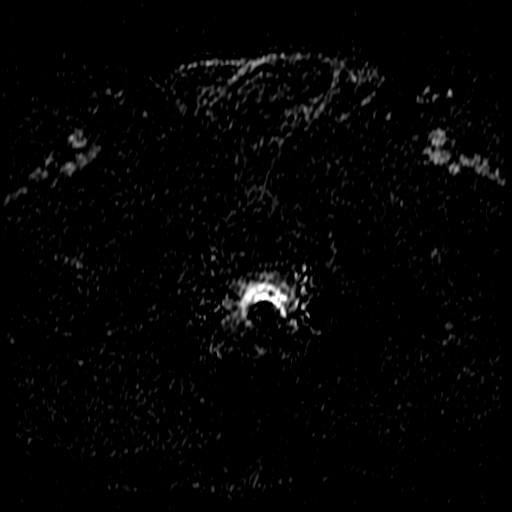

[23 of 56 positions shown; findings below may reference images not displayed]

FINDINGS: Prostate: Demonstrates moderate central gland enlargement and
heterogeneity, consistent with benign prostatic hyperplasia.

Relatively vague, non masslike T2 hypointensity is identified within
the left mid to apical gland, primarily laterally. This corresponds
to mildly restricted diffusion on series 9022 and probable early
post-contrast enhancement including on series 55161.

Volume: 4.3 x 3.8 x 5.5 cm.  (volume = 47 cm^3).

Transcapsular spread:  Absent

Seminal vesicle involvement: Absent

Neurovascular bundle involvement: Absent

Pelvic adenopathy: Absent

Bone metastasis: Absent

Other findings: Normal urinary bladder.  No significant free fluid.
IMPRESSION: 1. Vague T2 hypointensity throughout the left mid to apical gland,
especially laterally. Given concurrent mildly restricted diffusion
and probable early post-contrast enhancement, neoplasm cannot be
excluded. [REDACTED]
2.  No evidence of locally advanced or pelvic metastatic disease.

## 2018-10-10 ENCOUNTER — Ambulatory Visit (INDEPENDENT_AMBULATORY_CARE_PROVIDER_SITE_OTHER): Payer: Medicare Other | Admitting: Nurse Practitioner

## 2018-10-10 ENCOUNTER — Encounter: Payer: Self-pay | Admitting: Nurse Practitioner

## 2018-10-10 VITALS — BP 122/90 | HR 67 | Ht 70.0 in | Wt 150.0 lb

## 2018-10-10 DIAGNOSIS — Z23 Encounter for immunization: Secondary | ICD-10-CM

## 2018-10-10 DIAGNOSIS — I10 Essential (primary) hypertension: Secondary | ICD-10-CM | POA: Diagnosis not present

## 2018-10-10 MED ORDER — METOPROLOL TARTRATE 50 MG PO TABS: 50.0000 mg | ORAL_TABLET | Freq: Two times a day (BID) | ORAL | 5 refills | Status: DC

## 2018-10-10 NOTE — Progress Notes (Signed)
Peter Lam is a 72 y.o. male with the following history as recorded in EpicCare:  Patient Active Problem List   Diagnosis Date Noted  . Atrial flutter ([place]) 02/16/2018  . Prostate cancer ([place]) 07/05/2017  . Hx of adenomatous colonic polyps 05/07/2017  . FH: diabetes mellitus 05/25/2014  . Health maintenance examination 03/16/2013  . PSA, INCREASED 02/27/2009  . Hyperlipidemia 08/30/2008  . HYPERTENSION, BENIGN ESSENTIAL, LABILE 08/12/2007  . BPH (benign prostatic hypertrophy) with urinary obstruction 08/12/2007    Current Outpatient Medications  Medication Sig Dispense Refill  . metoprolol tartrate (LOPRESSOR) 50 MG tablet Take 1 tablet (50 mg total) by mouth 2 (two) times daily. 60 tablet 5   No current facility-administered medications for this visit.     Allergies: Ace inhibitors and Diltiazem hcl  Past Medical History:  Diagnosis Date  . Arthritis   . BPH (benign prostatic hypertrophy) with urinary obstruction    followed by Dr. Jeffie Lam at [facility]  . Cancer [facility])    Prostate cancer  . GERD (gastroesophageal reflux disease)   . Hx of adenomatous colonic polyps 05/07/2017  . Hypertension   . Inguinal hernia   . Prostate cancer [facility])     Past Surgical History:  Procedure Laterality Date  . INGUINAL HERNIA REPAIR  01/15/2012   Procedure: HERNIA REPAIR INGUINAL ADULT;  Surgeon: Peter Regal, MD;  Location: [place];  Service: General;  Laterality: Left;  Left inguinal hernia with mesh   . LYMPHADENECTOMY Bilateral 09/06/2017   Procedure: PELVIC LYMPHADENECTOMY;  Surgeon: Peter Bring, MD;  [facility];  Service: Urology;  Laterality: Bilateral;  . MULTIPLE TOOTH EXTRACTIONS    . PROSTATE BIOPSY    . ROBOT ASSISTED LAPAROSCOPIC RADICAL PROSTATECTOMY N/A 09/06/2017   Procedure: XI ROBOTIC ASSISTED LAPAROSCOPIC RADICAL PROSTATECTOMY LEVEL 2;  Surgeon: Peter Bring, MD;  [facility];  Service: Urology;  Laterality: N/A;  . TONSILLECTOMY AND  ADENOIDECTOMY      Family History  Problem Relation Age of Onset  . Alzheimer's disease Mother   . Hypertension Father   . Hypertension Brother   . Cancer Maternal Uncle        colon  . Colon cancer Maternal Uncle   . Hypertension Sister   . Throat cancer Other   . Cancer Maternal Uncle        throat  . Cancer Maternal Uncle        prostate    Social History   Tobacco Use  . Smoking status: Former Smoker    Packs/day: 1.00    Years: 20.00    Pack years: 20.00    Types: Cigarettes    Last attempt to quit: 12/07/1973    Years since quitting: 44.8  . Smokeless tobacco: Never Used  Substance Use Topics  . Alcohol use: No     Subjective:  Peter Lam is here today for follow up of hypertension, I last saw him in April and he was instructed to return to clinic in 1 month for a follow up, he actually did not return until today. He is maintained on metoprolol 50 BID, reports daily medication compliance without noted adverse effects, here requesting refill today Reports he does check blood pressure readings at home occasionally, tells me the readings are "good". He also tells me that he has been working on improving his diet, decrease pork and sodium, and Walking more He feels well today, no complaints, Denies weakness, syncope, headaches, vision changes, chest pain, shortness  of breath, edema.  BP Readings from Last 3 Encounters:  10/10/18 122/90  03/24/18 (!) 144/94  03/15/18 (!) 142/98    ROS- See HPI  Objective:  Vitals:   10/10/18 0803  BP: 122/90  Pulse: 67  SpO2: 99%  Weight: 150 lb (68 kg)  Height: 5\' 10"  (1.778 m)    General: Well developed, well nourished, in no acute distress  Skin : Warm and dry.  Head: Normocephalic and atraumatic  Eyes: Sclera and conjunctiva clear; pupils round and reactive to light; extraocular movements intact  Oropharynx: Pink, supple. No suspicious lesions  Neck: Supple Lungs: Respirations unlabored; clear to auscultation bilaterally   CVS exam: normal rate and regular rhythm, S1 and S2 normal.  Extremities: No edema, cyanosis Vessels: Symmetric bilaterally  Neurologic: Alert and oriented; speech intact; face symmetrical; moves all extremities well; CNII-XII intact without focal deficit   Assessment:  1. Need for influenza vaccination   2. HYPERTENSION, BENIGN ESSENTIAL, LABILE     Plan:  Discussed annual HM, annual labs today, he says he does not have time to stay for labs today, would prefer to update annual labs at next F/U.   Return in about 3 months (around 01/10/2019) for routine follow up, annual HM.  Orders Placed This Encounter  Procedures  . Flu vaccine HIGH DOSE PF    Requested Prescriptions   Signed Prescriptions Disp Refills  . metoprolol tartrate (LOPRESSOR) 50 MG tablet 60 tablet 5    Sig: Take 1 tablet (50 mg total) by mouth 2 (two) times daily.

## 2018-10-10 NOTE — Patient Instructions (Signed)
I will see you in 3 months

## 2018-10-10 NOTE — Assessment & Plan Note (Signed)
Stable Continue current medication Continue to monitor

## 2019-01-06 ENCOUNTER — Telehealth: Payer: Self-pay | Admitting: *Deleted

## 2019-01-06 NOTE — Telephone Encounter (Signed)
LVM for patient to call back in regards to scheduling AWV with our health coach.  

## 2019-01-11 ENCOUNTER — Ambulatory Visit (INDEPENDENT_AMBULATORY_CARE_PROVIDER_SITE_OTHER): Payer: Medicare Other | Admitting: Nurse Practitioner

## 2019-01-11 ENCOUNTER — Encounter: Payer: Self-pay | Admitting: Nurse Practitioner

## 2019-01-11 VITALS — BP 138/85 | HR 76 | Ht 70.0 in | Wt 154.0 lb

## 2019-01-11 DIAGNOSIS — Z1159 Encounter for screening for other viral diseases: Secondary | ICD-10-CM

## 2019-01-11 DIAGNOSIS — Z23 Encounter for immunization: Secondary | ICD-10-CM | POA: Diagnosis not present

## 2019-01-11 DIAGNOSIS — I10 Essential (primary) hypertension: Secondary | ICD-10-CM | POA: Diagnosis not present

## 2019-01-11 DIAGNOSIS — Z13220 Encounter for screening for lipoid disorders: Secondary | ICD-10-CM

## 2019-01-11 DIAGNOSIS — Z9189 Other specified personal risk factors, not elsewhere classified: Secondary | ICD-10-CM | POA: Diagnosis not present

## 2019-01-11 NOTE — Progress Notes (Signed)
Peter Lam is a 73 y.o. male with the following history as recorded in EpicCare:  Patient Active Problem List   Diagnosis Date Noted  . Atrial flutter ([place]) 02/16/2018  . Prostate cancer ([place]) 07/05/2017  . Hx of adenomatous colonic polyps 05/07/2017  . FH: diabetes mellitus 05/25/2014  . Health maintenance examination 03/16/2013  . PSA, INCREASED 02/27/2009  . Hyperlipidemia 08/30/2008  . HYPERTENSION, BENIGN ESSENTIAL, LABILE 08/12/2007  . BPH (benign prostatic hypertrophy) with urinary obstruction 08/12/2007    Current Outpatient Medications  Medication Sig Dispense Refill  . metoprolol tartrate (LOPRESSOR) 50 MG tablet Take 1 tablet (50 mg total) by mouth 2 (two) times daily. 60 tablet 5   No current facility-administered medications for this visit.     Allergies: Ace inhibitors and Diltiazem hcl  Past Medical History:  Diagnosis Date  . Arthritis   . BPH (benign prostatic hypertrophy) with urinary obstruction    followed by Dr. Jeffie Pollock at [facility]  . Cancer [facility])    Prostate cancer  . GERD (gastroesophageal reflux disease)   . Hx of adenomatous colonic polyps 05/07/2017  . Hypertension   . Inguinal hernia   . Prostate cancer [facility])     Past Surgical History:  Procedure Laterality Date  . INGUINAL HERNIA REPAIR  01/15/2012   Procedure: HERNIA REPAIR INGUINAL ADULT;  Surgeon: Earnstine Regal, MD;  Location: [place];  Service: General;  Laterality: Left;  Left inguinal hernia with mesh   . LYMPHADENECTOMY Bilateral 09/06/2017   Procedure: PELVIC LYMPHADENECTOMY;  Surgeon: Raynelle Bring, MD;  [facility];  Service: Urology;  Laterality: Bilateral;  . MULTIPLE TOOTH EXTRACTIONS    . PROSTATE BIOPSY    . ROBOT ASSISTED LAPAROSCOPIC RADICAL PROSTATECTOMY N/A 09/06/2017   Procedure: XI ROBOTIC ASSISTED LAPAROSCOPIC RADICAL PROSTATECTOMY LEVEL 2;  Surgeon: Raynelle Bring, MD;  [facility];  Service: Urology;  Laterality: N/A;  . TONSILLECTOMY AND  ADENOIDECTOMY      Family History  Problem Relation Age of Onset  . Alzheimer's disease Mother   . Hypertension Father   . Hypertension Brother   . Cancer Maternal Uncle        colon  . Colon cancer Maternal Uncle   . Hypertension Sister   . Throat cancer Other   . Cancer Maternal Uncle        throat  . Cancer Maternal Uncle        prostate    Social History   Tobacco Use  . Smoking status: Former Smoker    Packs/day: 1.00    Years: 20.00    Pack years: 20.00    Types: Cigarettes    Last attempt to quit: 12/07/1973    Years since quitting: 45.1  . Smokeless tobacco: Never Used  Substance Use Topics  . Alcohol use: No     Subjective:  Peter Lam Is here today for routine follow up, annual labs. He says he feels well today, no complaints.  Hypertension -maintained on metoprolol tartrate 50 BID Reports daily medication compliance without adverse medication effects.  BP Readings from Last 3 Encounters:  01/11/19 138/85  10/10/18 122/90  03/24/18 (!) 144/94    Review of Systems  Constitutional: Negative for chills and fever.  Eyes: Negative for blurred vision and double vision.  Respiratory: Negative for cough and shortness of breath.   Cardiovascular: Negative for chest pain, palpitations and leg swelling.  Gastrointestinal: Negative for abdominal pain, constipation, diarrhea, nausea and vomiting.  Musculoskeletal: Negative  for falls.  Skin: Negative for rash.  Neurological: Negative for speech change, loss of consciousness and weakness.  Endo/Heme/Allergies: Does not bruise/bleed easily.  Psychiatric/Behavioral: Negative for depression. The patient is not nervous/anxious.     Objective:  Vitals:   01/11/19 0824 01/11/19 0849  BP: 140/90 138/85  Pulse: 76   SpO2: 96%   Weight: 154 lb (69.9 kg)   Height: 5\' 10"  (1.778 m)     General: Well developed, well nourished, in no acute distress  Skin : Warm and dry.  Head: Normocephalic and atraumatic  Eyes: Sclera  and conjunctiva clear; pupils round and reactive to light; extraocular movements intact  Oropharynx: Pink, supple. No suspicious lesions  Neck: Supple without thyromegaly, adenopathy  Lungs: Respirations unlabored; clear to auscultation bilaterally without wheeze, rales, rhonchi  CVS exam: normal rate and regular rhythm, S1 and S2 normal.  Extremities: No edema, cyanosis, clubbing  Vessels: Symmetric bilaterally  Neurologic: Alert and oriented; speech intact; face symmetrical; moves all extremities well; CNII-XII intact without focal deficit  Psychiatric: Normal mood and affect.  Assessment:  1. HYPERTENSION, BENIGN ESSENTIAL, LABILE   2. Encounter for hepatitis C virus screening test for high risk patient   3. Screening for cholesterol level     Plan:   Encounter for hepatitis C virus screening test for high risk patient - Hepatitis C antibody; Future  Screening for cholesterol level He ate prior to appointment today, he was instructed to return to lab when fasting - Lipid panel; Future  Need for pneumococcal vaccination - Pneumococcal conjugate vaccine 13-valent IM   Return in about 6 months (around 07/12/2019) for routine follow up .  Orders Placed This Encounter  Procedures  . CBC    Standing Status:   Future    Standing Expiration Date:   01/12/2020  . Comprehensive metabolic panel    Standing Status:   Future    Standing Expiration Date:   01/12/2020  . Lipid panel    Standing Status:   Future    Standing Expiration Date:   01/12/2020  . Hepatitis C antibody    Standing Status:   Future    Standing Expiration Date:   02/09/2019    Requested Prescriptions    No prescriptions requested or ordered in this encounter

## 2019-01-11 NOTE — Assessment & Plan Note (Signed)
Stable Continue current medications Update labs - CBC; Future - Comprehensive metabolic panel; Future - Lipid panel; Future

## 2019-01-11 NOTE — Patient Instructions (Signed)
Return for labs downstairs when fasting -nothing to eat for 6 hours prior but you can drink water/black coffee  Lab is open 730-5

## 2019-04-20 ENCOUNTER — Telehealth: Payer: Self-pay | Admitting: Internal Medicine

## 2019-04-20 NOTE — Telephone Encounter (Signed)
-----   Message from Biagio Borg, MD sent at 04/09/2019  6:56 PM EDT ----- Regarding: transfer of care Peter Lam or other [place],  Pt is due now for ROV for transfer of care and HTN  Please contact this patient, and ask them to schedule with me as new PCP asap (even same day if possible) when you are able to address this, in order for the patient to continue have updated ongoing care without being lost in the system, and to keep up a high standard of care at [facility].  This visit would be for "office visit" to f/u general medical conditions (such as Diabetics who are due for f/u A1c or other chronic medical problem) .  Please be aware I would like 4 Doxy (virtual) visits if possible each Mon/Tues/Wed/Thur evenings that I can do from home.  The template has been changed to reflect the ability to do this, and I will just need to be aware to do this by watching my schedule.

## 2019-04-20 NOTE — Telephone Encounter (Signed)
Spoke to patient about scheduling and he said he did not want to scheduled at this time until he got his medical bills caught up. I offered a later date like august but he did not want to schedule. I informed him he would then not have a primary care doctor and he understood.

## 2019-07-12 ENCOUNTER — Ambulatory Visit: Payer: Medicare Other | Admitting: Nurse Practitioner

## 2019-08-07 ENCOUNTER — Telehealth: Payer: Self-pay | Admitting: Family

## 2019-08-07 NOTE — Telephone Encounter (Signed)
Copied from [place] [id number]. Topic: Appointment Scheduling - Scheduling Inquiry for Clinic >> Aug 07, 2019  9:05 AM Virl Axe D wrote: Reason for CRM: Pt would like to know if [facility] would accept him as a patient. Advised that approval would be needed first. Pt expressed understanding. Please advise. [phone]

## 2019-08-07 NOTE — Telephone Encounter (Signed)
Yes, it is fine

## 2019-08-07 NOTE — Telephone Encounter (Signed)
Scheduled

## 2019-09-25 ENCOUNTER — Encounter: Payer: Self-pay | Admitting: Family

## 2019-09-25 ENCOUNTER — Other Ambulatory Visit: Payer: Self-pay

## 2019-09-25 ENCOUNTER — Ambulatory Visit (INDEPENDENT_AMBULATORY_CARE_PROVIDER_SITE_OTHER): Payer: Medicare Other | Admitting: Family

## 2019-09-25 VITALS — BP 180/100 | HR 78 | Temp 98.0°F | Ht 70.0 in | Wt 153.0 lb

## 2019-09-25 DIAGNOSIS — I4892 Unspecified atrial flutter: Secondary | ICD-10-CM | POA: Diagnosis not present

## 2019-09-25 DIAGNOSIS — C61 Malignant neoplasm of prostate: Secondary | ICD-10-CM

## 2019-09-25 DIAGNOSIS — Z23 Encounter for immunization: Secondary | ICD-10-CM

## 2019-09-25 DIAGNOSIS — I10 Essential (primary) hypertension: Secondary | ICD-10-CM | POA: Diagnosis not present

## 2019-09-25 MED ORDER — METOPROLOL TARTRATE 50 MG PO TABS: 50.0000 mg | ORAL_TABLET | Freq: Two times a day (BID) | ORAL | 1 refills | Status: DC

## 2019-09-25 NOTE — Progress Notes (Signed)
Peter Lam is a 73 y.o. male with the following history as recorded in EpicCare:  Patient Active Problem List   Diagnosis Date Noted  . Atrial flutter ([place]) 02/16/2018  . Prostate cancer ([place]) 07/05/2017  . Hx of adenomatous colonic polyps 05/07/2017  . FH: diabetes mellitus 05/25/2014  . Health maintenance examination 03/16/2013  . PSA, INCREASED 02/27/2009  . Hyperlipidemia 08/30/2008  . HYPERTENSION, BENIGN ESSENTIAL, LABILE 08/12/2007  . BPH (benign prostatic hypertrophy) with urinary obstruction 08/12/2007    Current Outpatient Medications  Medication Sig Dispense Refill  . metoprolol tartrate (LOPRESSOR) 50 MG tablet Take 1 tablet (50 mg total) by mouth 2 (two) times daily. 180 tablet 1   No current facility-administered medications for this visit.     Allergies: Ace inhibitors and Diltiazem hcl  Past Medical History:  Diagnosis Date  . Arthritis   . BPH (benign prostatic hypertrophy) with urinary obstruction    follow[facility] by Dr. Jeffie Pollock at [facility]  . Cancer [facility])    Prostate cancer  . GERD (gastroesophageal reflux disease)   . Hx of adenomatous colonic polyps 05/07/2017  . Hypertension   . Inguinal hernia   . Prostate cancer [facility])     Past Surgical History:  Procedure Laterality Date  . INGUINAL HERNIA REPAIR  01/15/2012   Procedure: HERNIA REPAIR INGUINAL ADULT;  Surgeon: Earnstine Regal, MD;  Location: [place];  Service: General;  Laterality: Left;  Left inguinal hernia with mesh   . LYMPHADENECTOMY Bilateral 09/06/2017   Procedure: PELVIC LYMPHADENECTOMY;  Surgeon: Raynelle Bring, MD;  [facility];  Service: Urology;  Laterality: Bilateral;  . MULTIPLE TOOTH EXTRACTIONS    . PROSTATE BIOPSY    . ROBOT ASSISTED LAPAROSCOPIC RADICAL PROSTATECTOMY N/A 09/06/2017   Procedure: XI ROBOTIC ASSISTED LAPAROSCOPIC RADICAL PROSTATECTOMY LEVEL 2;  Surgeon: Raynelle Bring, MD;  [facility];  Service: Urology;  Laterality: N/A;  . TONSILLECTOMY  AND ADENOIDECTOMY      Family History  Problem Relation Age of Onset  . Alzheimer's disease Mother   . Hypertension Father   . Hypertension Brother   . Cancer Maternal Uncle        colon  . Colon cancer Maternal Uncle   . Hypertension Sister   . Throat cancer Other   . Cancer Maternal Uncle        throat  . Cancer Maternal Uncle        prostate    Social History   Tobacco Use  . Smoking status: Former Smoker    Packs/day: 1.00    Years: 20.00    Pack years: 20.00    Types: Cigarettes    Quit date: 12/07/1973    Years since quitting: 45.8  . Smokeless tobacco: Never Used  Substance Use Topics  . Alcohol use: No    Subjective:  Presents for transfer of care appointment from provider who left our office earlier this year; admits he has not been taking his blood pressure medication regularly x 2 months; "I have been stretching the pills out"; notes he cannot get his labs updated today as his son ( his transportation) has another appointment to get to this morning; agrees to come back in 1 months; Denies any chest pain, shortness of breath, blurred vision or headache.    Objective:  Vitals:   09/25/19 0939  BP: (!) 180/100  Pulse: 78  Temp: 98 F (36.7 C)  TempSrc: Oral  SpO2: 99%  Weight: 153 lb (69.4 kg)  Height: 5\' 10"  (1.778 m)    General: Well developed, well nourished, in no acute distress  Skin : Warm and dry.  Head: Normocephalic and atraumatic  Lungs: Respirations unlabored; clear to auscultation bilaterally without wheeze, rales, rhonchi  CVS exam: normal rate and regular rhythm.  Neurologic: Alert and oriented; speech intact; face symmetrical; moves all extremities well; CNII-XII intact without focal deficit  Assessment:  1. HYPERTENSION, BENIGN ESSENTIAL, LABILE   2. Need for influenza vaccination   3. Atrial flutter, unspecified type ([place]) Chronic  4. Prostate cancer ([place]) Chronic    Plan:  1. Uncontrolled; patient not taking medication regularly;  refill updated; stressed need to take medication daily as prescribed and let our office know when he needs refills; he agrees to get his labs updated at next OV- notes he does not have time today. 2. Flu shot given;  3. Stable; per last cardiology note in 2019, follow-up was prn. 4. Under care of [facility];   Return in about 1 month (around 10/26/2019).  Orders Placed This Encounter  Procedures  . Flu Vaccine QUAD High Dose(Fluad)    Requested Prescriptions   Signed Prescriptions Disp Refills  . metoprolol tartrate (LOPRESSOR) 50 MG tablet 180 tablet 1    Sig: Take 1 tablet (50 mg total) by mouth 2 (two) times daily.

## 2019-10-23 ENCOUNTER — Encounter: Payer: Self-pay | Admitting: Family

## 2019-10-23 ENCOUNTER — Other Ambulatory Visit: Payer: Self-pay

## 2019-10-23 ENCOUNTER — Other Ambulatory Visit (INDEPENDENT_AMBULATORY_CARE_PROVIDER_SITE_OTHER): Payer: Medicare Other

## 2019-10-23 ENCOUNTER — Ambulatory Visit (INDEPENDENT_AMBULATORY_CARE_PROVIDER_SITE_OTHER): Payer: Medicare Other | Admitting: Family

## 2019-10-23 VITALS — BP 148/98 | HR 76 | Temp 97.8°F | Ht 70.0 in | Wt 153.0 lb

## 2019-10-23 DIAGNOSIS — Z1159 Encounter for screening for other viral diseases: Secondary | ICD-10-CM

## 2019-10-23 DIAGNOSIS — Z13220 Encounter for screening for lipoid disorders: Secondary | ICD-10-CM

## 2019-10-23 DIAGNOSIS — I4892 Unspecified atrial flutter: Secondary | ICD-10-CM

## 2019-10-23 DIAGNOSIS — C61 Malignant neoplasm of prostate: Secondary | ICD-10-CM

## 2019-10-23 DIAGNOSIS — I10 Essential (primary) hypertension: Secondary | ICD-10-CM

## 2019-10-23 LAB — LIPID PANEL
Cholesterol: 177 mg/dL (ref 0–200)
HDL: 50.2 mg/dL (ref >39.00)
LDL Cholesterol: 115 mg/dL — ABNORMAL HIGH (ref 0–99)
NonHDL: 127.13
Total CHOL/HDL Ratio: 4
Triglycerides: 60 mg/dL (ref 0.0–149.0)
VLDL: 12 mg/dL (ref 0.0–40.0)

## 2019-10-23 LAB — COMPREHENSIVE METABOLIC PANEL
ALT: 13 U/L (ref 0–53)
AST: 14 U/L (ref 0–37)
Albumin: 4.5 g/dL (ref 3.5–5.2)
Alkaline Phosphatase: 89 U/L (ref 39–117)
BUN: 7 mg/dL (ref 6–23)
CO2: 30 mEq/L (ref 19–32)
Calcium: 9.2 mg/dL (ref 8.4–10.5)
Chloride: 104 mEq/L (ref 96–112)
Creatinine, Ser: 1.01 mg/dL (ref 0.40–1.50)
GFR: 88.03 mL/min (ref >60.00)
Glucose, Bld: 99 mg/dL (ref 70–99)
Potassium: 3.7 mEq/L (ref 3.5–5.1)
Sodium: 142 mEq/L (ref 135–145)
Total Bilirubin: 0.8 mg/dL (ref 0.2–1.2)
Total Protein: 6.9 g/dL (ref 6.0–8.3)

## 2019-10-23 LAB — CBC WITH DIFFERENTIAL/PLATELET
Basophils Absolute: 0 10*3/uL (ref 0.0–0.1)
Basophils Relative: 0.7 % (ref 0.0–3.0)
Eosinophils Absolute: 0.1 10*3/uL (ref 0.0–0.7)
Eosinophils Relative: 1.4 % (ref 0.0–5.0)
HCT: 50.2 % (ref 39.0–52.0)
Hemoglobin: 16.7 g/dL (ref 13.0–17.0)
Lymphocytes Relative: 17.6 % (ref 12.0–46.0)
Lymphs Abs: 1.2 10*3/uL (ref 0.7–4.0)
MCHC: 33.2 g/dL (ref 30.0–36.0)
MCV: 94.9 fl (ref 78.0–100.0)
Monocytes Absolute: 0.6 10*3/uL (ref 0.1–1.0)
Monocytes Relative: 9.5 % (ref 3.0–12.0)
Neutro Abs: 4.7 10*3/uL (ref 1.4–7.7)
Neutrophils Relative %: 70.8 % (ref 43.0–77.0)
Platelets: 174 10*3/uL (ref 150.0–400.0)
RBC: 5.29 Mil/uL (ref 4.22–5.81)
RDW: 14.9 % (ref 11.5–15.5)
WBC: 6.7 10*3/uL (ref 4.0–10.5)

## 2019-10-23 MED ORDER — LOSARTAN POTASSIUM 50 MG PO TABS: 50.0000 mg | ORAL_TABLET | Freq: Every day | ORAL | 0 refills | Status: DC

## 2019-10-23 NOTE — Progress Notes (Signed)
Peter Lam is a 73 y.o. male with the following history as recorded in EpicCare:  Patient Active Problem List   Diagnosis Date Noted  . Atrial flutter ([place]) 02/16/2018  . Prostate cancer ([place]) 07/05/2017  . Hx of adenomatous colonic polyps 05/07/2017  . FH: diabetes mellitus 05/25/2014  . Health maintenance examination 03/16/2013  . PSA, INCREASED 02/27/2009  . Hyperlipidemia 08/30/2008  . HYPERTENSION, BENIGN ESSENTIAL, LABILE 08/12/2007  . BPH (benign prostatic hypertrophy) with urinary obstruction 08/12/2007    Current Outpatient Medications  Medication Sig Dispense Refill  . metoprolol tartrate (LOPRESSOR) 50 MG tablet Take 1 tablet (50 mg total) by mouth 2 (two) times daily. 180 tablet 1  . losartan (COZAAR) 50 MG tablet Take 1 tablet (50 mg total) by mouth daily. 90 tablet 0   No current facility-administered medications for this visit.     Allergies: Ace inhibitors and Diltiazem hcl  Past Medical History:  Diagnosis Date  . Arthritis   . BPH (benign prostatic hypertrophy) with urinary obstruction    followed by Dr. Jeffie Pollock at [facility]  . Cancer [facility])    Prostate cancer  . GERD (gastroesophageal reflux disease)   . Hx of adenomatous colonic polyps 05/07/2017  . Hypertension   . Inguinal hernia   . Prostate cancer [facility])     Past Surgical History:  Procedure Laterality Date  . INGUINAL HERNIA REPAIR  01/15/2012   Procedure: HERNIA REPAIR INGUINAL ADULT;  Surgeon: Earnstine Regal, MD;  Location: [place];  Service: General;  Laterality: Left;  Left inguinal hernia with mesh   . LYMPHADENECTOMY Bilateral 09/06/2017   Procedure: PELVIC LYMPHADENECTOMY;  Surgeon: Raynelle Bring, MD;  [facility];  Service: Urology;  Laterality: Bilateral;  . MULTIPLE TOOTH EXTRACTIONS    . PROSTATE BIOPSY    . ROBOT ASSISTED LAPAROSCOPIC RADICAL PROSTATECTOMY N/A 09/06/2017   Procedure: XI ROBOTIC ASSISTED LAPAROSCOPIC RADICAL PROSTATECTOMY LEVEL 2;  Surgeon:  Raynelle Bring, MD;  [facility];  Service: Urology;  Laterality: N/A;  . TONSILLECTOMY AND ADENOIDECTOMY      Family History  Problem Relation Age of Onset  . Alzheimer's disease Mother   . Hypertension Father   . Hypertension Brother   . Cancer Maternal Uncle        colon  . Colon cancer Maternal Uncle   . Hypertension Sister   . Throat cancer Other   . Cancer Maternal Uncle        throat  . Cancer Maternal Uncle        prostate    Social History   Tobacco Use  . Smoking status: Former Smoker    Packs/day: 1.00    Years: 20.00    Pack years: 20.00    Types: Cigarettes    Quit date: 12/07/1973    Years since quitting: 45.9  . Smokeless tobacco: Never Used  Substance Use Topics  . Alcohol use: No    Subjective:  1 month follow-up on hypertension; notes that in the past week, he started taking his medication regularly as prescribed; is taking his Lopressor twice a day; Denies any chest pain, shortness of breath, blurred vision or headache  Objective:  Vitals:   10/23/19 1000  BP: (!) 148/98  Pulse: 76  Temp: 97.8 F (36.6 C)  TempSrc: Oral  SpO2: 99%  Weight: 153 lb (69.4 kg)  Height: _0  (1.778 m)    General: Well developed, well nourished, in no acute distress  Skin : Warm  and dry.  Head: Normocephalic and atraumatic  Lungs: Respirations unlabored; clear to auscultation bilaterally without wheeze, rales, rhonchi  CVS exam: normal rate and regular rhythm.  Neurologic: Alert and oriented; speech intact; face symmetrical; moves all extremities well; CNII-XII intact without focal deficit   Assessment:  1. HYPERTENSION, BENIGN ESSENTIAL, LABILE   2. Atrial flutter, unspecified type ([facility]) Chronic  3. Prostate cancer ([place]) Chronic  4. Lipid screening   5. Need for hepatitis C screening test     Plan:  1. Uncontrolled; add Losartan 50 mg daily; 2. Stay on Lopressor 50 mg bid; most recent cardiology note indicated that follow up was prn. 3. Stable; under  care of [facility]; 4. Check lipid panel; 5. Check Hep C;   Return in about 3 months (around 01/23/2020).  Orders Placed This Encounter  Procedures  . CBC w/Diff    Standing Status:   Future    Number of Occurrences:   1    Standing Expiration Date:   10/22/2020  . Comp Met (CMET)    Standing Status:   Future    Number of Occurrences:   1    Standing Expiration Date:   10/22/2020  . Lipid panel    Standing Status:   Future    Number of Occurrences:   1    Standing Expiration Date:   10/22/2020  . Hepatitis C Antibody    Standing Status:   Future    Number of Occurrences:   1    Standing Expiration Date:   10/22/2020    Requested Prescriptions   Signed Prescriptions Disp Refills  . losartan (COZAAR) 50 MG tablet 90 tablet 0    Sig: Take 1 tablet (50 mg total) by mouth daily.

## 2019-10-24 LAB — HEPATITIS C ANTIBODY
Hepatitis C Ab: NONREACTIVE
SIGNAL TO CUT-OFF: 0.02 (ref <1.00)

## 2020-01-22 ENCOUNTER — Ambulatory Visit: Payer: Medicare Other | Admitting: Family

## 2020-02-12 ENCOUNTER — Encounter: Payer: Self-pay | Admitting: Family

## 2020-02-12 ENCOUNTER — Ambulatory Visit (INDEPENDENT_AMBULATORY_CARE_PROVIDER_SITE_OTHER): Payer: Medicare Other | Admitting: Family

## 2020-02-12 ENCOUNTER — Other Ambulatory Visit: Payer: Self-pay

## 2020-02-12 DIAGNOSIS — I10 Essential (primary) hypertension: Secondary | ICD-10-CM

## 2020-02-12 MED ORDER — METOPROLOL TARTRATE 50 MG PO TABS: 50.0000 mg | ORAL_TABLET | Freq: Two times a day (BID) | ORAL | 1 refills | Status: DC

## 2020-02-12 MED ORDER — LOSARTAN POTASSIUM 100 MG PO TABS: 100.0000 mg | ORAL_TABLET | Freq: Every day | ORAL | 3 refills | Status: DC

## 2020-02-12 NOTE — Progress Notes (Signed)
Peter Lam is a 74 y.o. male with the following history as recorded in EpicCare:  Patient Active Problem List   Diagnosis Date Noted  . Atrial flutter ([place]) 02/16/2018  . Prostate cancer ([place]) 07/05/2017  . Hx of adenomatous colonic polyps 05/07/2017  . FH: diabetes mellitus 05/25/2014  . Health maintenance examination 03/16/2013  . PSA, INCREASED 02/27/2009  . Hyperlipidemia 08/30/2008  . HYPERTENSION, BENIGN ESSENTIAL, LABILE 08/12/2007  . BPH (benign prostatic hypertrophy) with urinary obstruction 08/12/2007    Current Outpatient Medications  Medication Sig Dispense Refill  . losartan (COZAAR) 100 MG tablet Take 1 tablet (100 mg total) by mouth daily. 90 tablet 3  . metoprolol tartrate (LOPRESSOR) 50 MG tablet Take 1 tablet (50 mg total) by mouth 2 (two) times daily. 180 tablet 1   No current facility-administered medications for this visit.    Allergies: Ace inhibitors and Diltiazem hcl  Past Medical History:  Diagnosis Date  . Arthritis   . BPH (benign prostatic hypertrophy) with urinary obstruction    followed by Dr. Jeffie Pollock at [facility]rology  . Cancer [facility])    Prostate cancer  . GERD (gastroesophageal reflux disease)   . Hx of adenomatous colonic polyps 05/07/2017  . Hypertension   . Inguinal hernia   . Prostate cancer [facility])     Past Surgical History:  Procedure Laterality Date  . INGUINAL HERNIA REPAIR  01/15/2012   Procedure: HERNIA REPAIR INGUINAL ADULT;  Surgeon: Earnstine Regal, MD;  Location: [place];  Service: General;  Laterality: Left;  Left inguinal hernia with mesh   . LYMPHADENECTOMY Bilateral 09/06/2017   Procedure: PELVIC LYMPHADENECTOMY;  Surgeon: Raynelle Bring, MD;  [facility];  Service: Urology;  Laterality: Bilateral;  . MULTIPLE TOOTH EXTRACTIONS    . PROSTATE BIOPSY    . ROBOT ASSISTED LAPAROSCOPIC RADICAL PROSTATECTOMY N/A 09/06/2017   Procedure: XI ROBOTIC ASSISTED LAPAROSCOPIC RADICAL PROSTATECTOMY LEVEL 2;  Surgeon:  Raynelle Bring, MD;  [facility];  Service: Urology;  Laterality: N/A;  . TONSILLECTOMY AND ADENOIDECTOMY      Family History  Problem Relation Age of Onset  . Alzheimer's disease Mother   . Hypertension Father   . Hypertension Brother   . Cancer Maternal Uncle        colon  . Colon cancer Maternal Uncle   . Hypertension Sister   . Throat cancer Other   . Cancer Maternal Uncle        throat  . Cancer Maternal Uncle        prostate    Social History   Tobacco Use  . Smoking status: Former Smoker    Packs/day: 1.00    Years: 20.00    Pack years: 20.00    Types: Cigarettes    Quit date: 12/07/1973    Years since quitting: 46.2  . Smokeless tobacco: Never Used  Substance Use Topics  . Alcohol use: No    Subjective:  Presents for a follow-up on hypertension; notes he is very upset today as there was a misunderstanding about the time of his appointment; Is not taking both of his medications- thinks he has been off his Losartan for at least one week; admits he has been eating a lot of luncheon meat/ increased salt; Denies any chest pain, shortness of breath, blurred vision or headache    Objective:  Vitals:   02/12/20 1035  BP: (!) 152/100  Pulse: 82  Temp: 98 F (36.7 C)  TempSrc: Oral  SpO2:  99%  Weight: 155 lb 12.8 oz (70.7 kg)  Height: 5\' 10"  (1.778 m)    General: Well developed, well nourished, in no acute distress  Skin : Warm and dry.  Head: Normocephalic and atraumatic  Lungs: Respirations unlabored; clear to auscultation bilaterally without wheeze, rales, rhonchi  CVS exam: normal rate and regular rhythm.  Neurologic: Alert and oriented; speech intact; face symmetrical; moves all extremities well; CNII-XII intact without focal deficit  Assessment:  1. HYPERTENSION, BENIGN ESSENTIAL, LABILE     Plan:  Not taking medication as prescribed; needs to limit salt intake; Rx for Metoprolol bid and increase Losartan to 100 mg qd; okay to follow-up in 6 months;  patient does not like more frequent appointments due to transportation issues.  This visit occurred during the SARS-CoV-2 public health emergency.  Safety protocols were in place, including screening questions prior to the visit, additional usage of staff PPE, and extensive cleaning of exam room while observing appropriate contact time as indicated for disinfecting solutions.      No follow-ups on file.  No orders of the defined types were placed in this encounter.   Requested Prescriptions   Signed Prescriptions Disp Refills  . losartan (COZAAR) 100 MG tablet 90 tablet 3    Sig: Take 1 tablet (100 mg total) by mouth daily.  . metoprolol tartrate (LOPRESSOR) 50 MG tablet 180 tablet 1    Sig: Take 1 tablet (50 mg total) by mouth 2 (two) times daily.

## 2020-02-23 DIAGNOSIS — Z23 Encounter for immunization: Secondary | ICD-10-CM | POA: Diagnosis not present

## 2020-03-22 DIAGNOSIS — Z23 Encounter for immunization: Secondary | ICD-10-CM | POA: Diagnosis not present

## 2020-10-20 DIAGNOSIS — Z23 Encounter for immunization: Secondary | ICD-10-CM | POA: Diagnosis not present

## 2020-10-29 DIAGNOSIS — Z23 Encounter for immunization: Secondary | ICD-10-CM | POA: Diagnosis not present

## 2021-02-14 ENCOUNTER — Other Ambulatory Visit: Payer: Self-pay

## 2021-02-14 ENCOUNTER — Ambulatory Visit (INDEPENDENT_AMBULATORY_CARE_PROVIDER_SITE_OTHER): Payer: Medicare Other | Admitting: Family

## 2021-02-14 ENCOUNTER — Encounter: Payer: Self-pay | Admitting: Family

## 2021-02-14 VITALS — BP 162/110 | HR 116 | Temp 97.8°F | Ht 70.0 in | Wt 143.0 lb

## 2021-02-14 DIAGNOSIS — I10 Essential (primary) hypertension: Secondary | ICD-10-CM | POA: Diagnosis not present

## 2021-02-14 DIAGNOSIS — R000 Tachycardia, unspecified: Secondary | ICD-10-CM

## 2021-02-14 DIAGNOSIS — Z13220 Encounter for screening for lipoid disorders: Secondary | ICD-10-CM | POA: Diagnosis not present

## 2021-02-14 DIAGNOSIS — E538 Deficiency of other specified B group vitamins: Secondary | ICD-10-CM

## 2021-02-14 DIAGNOSIS — C61 Malignant neoplasm of prostate: Secondary | ICD-10-CM

## 2021-02-14 LAB — LIPID PANEL
Cholesterol: 200 mg/dL (ref 0–200)
HDL: 56.1 mg/dL (ref >39.00)
LDL Cholesterol: 134 mg/dL — ABNORMAL HIGH (ref 0–99)
NonHDL: 144.39
Total CHOL/HDL Ratio: 4
Triglycerides: 51 mg/dL (ref 0.0–149.0)
VLDL: 10.2 mg/dL (ref 0.0–40.0)

## 2021-02-14 LAB — CBC WITH DIFFERENTIAL/PLATELET
Basophils Absolute: 0 10*3/uL (ref 0.0–0.1)
Basophils Relative: 0.5 % (ref 0.0–3.0)
Eosinophils Absolute: 0.1 10*3/uL (ref 0.0–0.7)
Eosinophils Relative: 1.1 % (ref 0.0–5.0)
HCT: 50.7 % (ref 39.0–52.0)
Hemoglobin: 16.9 g/dL (ref 13.0–17.0)
Lymphocytes Relative: 15 % (ref 12.0–46.0)
Lymphs Abs: 0.8 10*3/uL (ref 0.7–4.0)
MCHC: 33.4 g/dL (ref 30.0–36.0)
MCV: 93.2 fl (ref 78.0–100.0)
Monocytes Absolute: 0.4 10*3/uL (ref 0.1–1.0)
Monocytes Relative: 8 % (ref 3.0–12.0)
Neutro Abs: 4.2 10*3/uL (ref 1.4–7.7)
Neutrophils Relative %: 75.4 % (ref 43.0–77.0)
Platelets: 195 10*3/uL (ref 150.0–400.0)
RBC: 5.44 Mil/uL (ref 4.22–5.81)
RDW: 14.5 % (ref 11.5–15.5)
WBC: 5.6 10*3/uL (ref 4.0–10.5)

## 2021-02-14 LAB — COMPREHENSIVE METABOLIC PANEL
ALT: 12 U/L (ref 0–53)
AST: 13 U/L (ref 0–37)
Albumin: 4.5 g/dL (ref 3.5–5.2)
Alkaline Phosphatase: 98 U/L (ref 39–117)
BUN: 5 mg/dL — ABNORMAL LOW (ref 6–23)
CO2: 32 mEq/L (ref 19–32)
Calcium: 9.6 mg/dL (ref 8.4–10.5)
Chloride: 103 mEq/L (ref 96–112)
Creatinine, Ser: 1.01 mg/dL (ref 0.40–1.50)
GFR: 74.23 mL/min (ref >60.00)
Glucose, Bld: 91 mg/dL (ref 70–99)
Potassium: 3.7 mEq/L (ref 3.5–5.1)
Sodium: 142 mEq/L (ref 135–145)
Total Bilirubin: 1 mg/dL (ref 0.2–1.2)
Total Protein: 6.8 g/dL (ref 6.0–8.3)

## 2021-02-14 LAB — PSA: PSA: 0 ng/mL — ABNORMAL LOW (ref 0.10–4.00)

## 2021-02-14 LAB — VITAMIN B12: Vitamin B-12: 234 pg/mL (ref 211–911)

## 2021-02-14 MED ORDER — LOSARTAN POTASSIUM 100 MG PO TABS: 100.0000 mg | ORAL_TABLET | Freq: Every day | ORAL | 3 refills | Status: DC

## 2021-02-14 MED ORDER — METOPROLOL TARTRATE 50 MG PO TABS: 50.0000 mg | ORAL_TABLET | Freq: Two times a day (BID) | ORAL | 1 refills | Status: DC

## 2021-02-14 NOTE — Addendum Note (Signed)
Addended by: Shirlyn Goltz on: 02/14/2021 03:39 PM   Modules accepted: Orders

## 2021-02-14 NOTE — Progress Notes (Signed)
Peter Lam is a 75 y.o. male with the following history as recorded in EpicCare:  Patient Active Problem List   Diagnosis Date Noted  . Atrial flutter ([place]) 02/16/2018  . Prostate cancer ([place]) 07/05/2017  . Hx of adenomatous colonic polyps 05/07/2017  . FH: diabetes mellitus 05/25/2014  . Health maintenance examination 03/16/2013  . PSA, INCREASED 02/27/2009  . Hyperlipidemia 08/30/2008  . HYPERTENSION, BENIGN ESSENTIAL, LABILE 08/12/2007  . BPH (benign prostatic hypertrophy) with urinary obstruction 08/12/2007    Current Outpatient Medications  Medication Sig Dispense Refill  . losartan (COZAAR) 100 MG tablet Take 1 tablet (100 mg total) by mouth daily. 90 tablet 3  . metoprolol tartrate (LOPRESSOR) 50 MG tablet Take 1 tablet (50 mg total) by mouth 2 (two) times daily. 180 tablet 1   No current facility-administered medications for this visit.    Allergies: Ace inhibitors and Diltiazem hcl  Past Medical History:  Diagnosis Date  . Arthritis   . BPH (benign prostatic hypertrophy) with urinary obstruction    followed by Dr. Jeffie Pollock at [facility]  . Cancer [facility])    Prostate cancer  . GERD (gastroesophageal reflux disease)   . Hx of adenomatous colonic polyps 05/07/2017  . Hypertension   . Inguinal hernia   . Prostate cancer [facility])     Past Surgical History:  Procedure Laterality Date  . INGUINAL HERNIA REPAIR  01/15/2012   Procedure: HERNIA REPAIR INGUINAL ADULT;  Surgeon: Earnstine Regal, MD;  Location: [place];  Service: General;  Laterality: Left;  Left inguinal hernia with mesh   . LYMPHADENECTOMY Bilateral 09/06/2017   Procedure: PELVIC LYMPHADENECTOMY;  Surgeon: Raynelle Bring, MD;  [facility];  Service: Urology;  Laterality: Bilateral;  . MULTIPLE TOOTH EXTRACTIONS    . PROSTATE BIOPSY    . ROBOT ASSISTED LAPAROSCOPIC RADICAL PROSTATECTOMY N/A 09/06/2017   Procedure: XI ROBOTIC ASSISTED LAPAROSCOPIC RADICAL PROSTATECTOMY LEVEL 2;  Surgeon:  Raynelle Bring, MD;  [facility];  Service: Urology;  Laterality: N/A;  . TONSILLECTOMY AND ADENOIDECTOMY      Family History  Problem Relation Age of Onset  . Alzheimer's disease Mother   . Hypertension Father   . Hypertension Brother   . Cancer Maternal Uncle        colon  . Colon cancer Maternal Uncle   . Hypertension Sister   . Throat cancer Other   . Cancer Maternal Uncle        throat  . Cancer Maternal Uncle        prostate    Social History   Tobacco Use  . Smoking status: Former Smoker    Packs/day: 1.00    Years: 20.00    Pack years: 20.00    Types: Cigarettes    Quit date: 12/07/1973    Years since quitting: 47.2  . Smokeless tobacco: Never Used  Substance Use Topics  . Alcohol use: No    Subjective:  Follow-up on hypertension; last OV was in March 2021- was due for follow up in September 2021; Accompanied by his son today; Admits not taking his medications as prescribed- notes he did not understand to stay on his Metoprolol twice a day; only taking Losartan 100 mg daily; has seen cardiology in the past with elevated pulse- 2019/ not seen there since 2019;  Denies any chest pain or shortness of breath- repeatedly mentions how his neighbors "have lights at their house" that make him feel like he is "vibrating in his own  house." Son is planning to go by the house tonight to see if he can determine what is causing symptoms;    Objective:  Vitals:   02/14/21 0848  BP: (!) 162/110  Pulse: (!) 116  Temp: 97.8 F (36.6 C)  TempSrc: Oral  SpO2: 99%  Weight: 143 lb (64.9 kg)  Height: '5\' 10"'  (1.778 m)    General: Well developed, well nourished, in no acute distress  Skin : Warm and dry.  Head: Normocephalic and atraumatic  Eyes: Sclera and conjunctiva clear; pupils round and reactive to light; extraocular movements intact  Ears: External normal; canals clear; tympanic membranes normal  Oropharynx: Pink, supple. No suspicious lesions  Neck: Supple without  thyromegaly, adenopathy  Lungs: Respirations unlabored; clear to auscultation bilaterally without wheeze, rales, rhonchi  CVS exam: normal rhythm, tachycardia Musculoskeletal: No deformities; no active joint inflammation  Extremities: No edema, cyanosis, clubbing  Vessels: Symmetric bilaterally  Neurologic: Alert and oriented; speech intact; face symmetrical; moves all extremities well; CNII-XII intact without focal deficit   Assessment:  1. HYPERTENSION, BENIGN ESSENTIAL, LABILE   2. Tachycardia   3. Lipid screening   4. Prostate cancer ([place])   5. Low vitamin B12 level     Plan:  EKG shows NSR/ pulse at 98 on EKG; extensive medication review done today- patient understands to be on both medications; Update labs today and plan to follow-up in 1 month;  This visit occurred during the SARS-CoV-2 public health emergency.  Safety protocols were in place, including screening questions prior to the visit, additional usage of staff PPE, and extensive cleaning of exam room while observing appropriate contact time as indicated for disinfecting solutions.      Return in about 1 month (around 03/17/2021).  Orders Placed This Encounter  Procedures  . CBC with Differential/Platelet    Standing Status:   Future    Standing Expiration Date:   02/14/2022  . Comp Met (CMET)    Standing Status:   Future    Standing Expiration Date:   02/14/2022  . Lipid panel    Standing Status:   Future    Standing Expiration Date:   02/14/2022  . Vitamin B12    Standing Status:   Future    Standing Expiration Date:   02/14/2022  . PSA    Standing Status:   Future    Standing Expiration Date:   02/14/2022    Requested Prescriptions   Signed Prescriptions Disp Refills  . losartan (COZAAR) 100 MG tablet 90 tablet 3    Sig: Take 1 tablet (100 mg total) by mouth daily.  . metoprolol tartrate (LOPRESSOR) 50 MG tablet 180 tablet 1    Sig: Take 1 tablet (50 mg total) by mouth 2 (two) times daily.

## 2021-03-14 ENCOUNTER — Other Ambulatory Visit: Payer: Self-pay

## 2021-03-14 ENCOUNTER — Encounter: Payer: Self-pay | Admitting: Family

## 2021-03-14 ENCOUNTER — Ambulatory Visit (INDEPENDENT_AMBULATORY_CARE_PROVIDER_SITE_OTHER): Payer: Medicare Other | Admitting: Family

## 2021-03-14 VITALS — BP 160/100 | HR 84 | Temp 97.9°F | Ht 69.0 in | Wt 146.6 lb

## 2021-03-14 DIAGNOSIS — I10 Essential (primary) hypertension: Secondary | ICD-10-CM

## 2021-03-14 NOTE — Progress Notes (Signed)
Peter Lam is a 75 y.o. male with the following history as recorded in EpicCare:  Patient Active Problem List   Diagnosis Date Noted  . Atrial flutter ([place]) 02/16/2018  . Prostate cancer ([place]) 07/05/2017  . Hx of adenomatous colonic polyps 05/07/2017  . FH: diabetes mellitus 05/25/2014  . Health maintenance examination 03/16/2013  . PSA, INCREASED 02/27/2009  . Hyperlipidemia 08/30/2008  . HYPERTENSION, BENIGN ESSENTIAL, LABILE 08/12/2007  . BPH (benign prostatic hypertrophy) with urinary obstruction 08/12/2007    Current Outpatient Medications  Medication Sig Dispense Refill  . losartan (COZAAR) 100 MG tablet Take 1 tablet (100 mg total) by mouth daily. 90 tablet 3  . metoprolol tartrate (LOPRESSOR) 50 MG tablet Take 1 tablet (50 mg total) by mouth 2 (two) times daily. 180 tablet 1   No current facility-administered medications for this visit.    Allergies: Ace inhibitors and Diltiazem hcl  Past Medical History:  Diagnosis Date  . Arthritis   . BPH (benign prostatic hypertrophy) with urinary obstruction    followed by Dr. Jeffie Pollock at [facility]rology  . Cancer [facility])    Prostate cancer  . GERD (gastroesophageal reflux disease)   . Hx of adenomatous colonic polyps 05/07/2017  . Hypertension   . Inguinal hernia   . Prostate cancer [facility])     Past Surgical History:  Procedure Laterality Date  . INGUINAL HERNIA REPAIR  01/15/2012   Procedure: HERNIA REPAIR INGUINAL ADULT;  Surgeon: Earnstine Regal, MD;  Location: [place];  Service: General;  Laterality: Left;  Left inguinal hernia with mesh   . LYMPHADENECTOMY Bilateral 09/06/2017   Procedure: PELVIC LYMPHADENECTOMY;  Surgeon: Raynelle Bring, MD;  [facility];  Service: Urology;  Laterality: Bilateral;  . MULTIPLE TOOTH EXTRACTIONS    . PROSTATE BIOPSY    . ROBOT ASSISTED LAPAROSCOPIC RADICAL PROSTATECTOMY N/A 09/06/2017   Procedure: XI ROBOTIC ASSISTED LAPAROSCOPIC RADICAL PROSTATECTOMY LEVEL 2;  Surgeon:  Raynelle Bring, MD;  [facility];  Service: Urology;  Laterality: N/A;  . TONSILLECTOMY AND ADENOIDECTOMY      Family History  Problem Relation Age of Onset  . Alzheimer's disease Mother   . Hypertension Father   . Hypertension Brother   . Cancer Maternal Uncle        colon  . Colon cancer Maternal Uncle   . Hypertension Sister   . Throat cancer Other   . Cancer Maternal Uncle        throat  . Cancer Maternal Uncle        prostate    Social History   Tobacco Use  . Smoking status: Former Smoker    Packs/day: 1.00    Years: 20.00    Pack years: 20.00    Types: Cigarettes    Quit date: 12/07/1973    Years since quitting: 47.2  . Smokeless tobacco: Never Used  Substance Use Topics  . Alcohol use: No    Subjective:   1 month follow-up on hypertension; notes he did not take his medication this morning but has been trying to take both of his medications on a regular basis; he is accompanied by his son today; he is again adamant that the stress from his living situation is causing his blood pressure to be elevated; he continues to feel that his neighbors outside lights and cars are causing "currents/ vibrations" in his home that are affecting his ability to sleep; unfortunately, his son has not been able to find these vibrations when he  has gone to visit the home; the patient is adamant that the symptoms will be resolved once he moves into a new home and he will be doing better.  Patient is adamant that he is "not having any psychosomatic" issues and refuses to consider seeing a neurologist;   Objective:  Vitals:   03/14/21 0857  BP: (!) 160/100  Pulse: 84  Temp: 97.9 F (36.6 C)  TempSrc: Oral  SpO2: 99%  Weight: 146 lb 9.6 oz (66.5 kg)  Height: 5\' 9"  (1.753 m)    General: Well developed, well nourished, in no acute distress  Skin : Warm and dry.  Head: Normocephalic and atraumatic  Lungs: Respirations unlabored; clear to auscultation bilaterally without wheeze,  rales, rhonchi  CVS exam: normal rate and regular rhythm.  Neurologic: Alert and oriented; speech intact; face symmetrical; moves all extremities well; CNII-XII intact without focal deficit   Assessment:  1. HYPERTENSION, BENIGN ESSENTIAL, LABILE     Plan:  Patient is adamant that he will not take a 3rd blood pressure medication at this time; he feels that the stress from his living situation is the problem- unfortunately, his son has not been able to witness the issues at the home that the patient is concerned about; however, they plan to move the patient to a new home and he agrees to follow up in 1 month; regardless of living situation, if pressure is elevated, he agrees to take 3rd medicine.  He is adamant that he will not consider seeing a neurologist at this time.  This visit occurred during the SARS-CoV-2 public health emergency.  Safety protocols were in place, including screening questions prior to the visit, additional usage of staff PPE, and extensive cleaning of exam room while observing appropriate contact time as indicated for disinfecting solutions.   Time spent 30+ minutes  No follow-ups on file.  No orders of the defined types were placed in this encounter.   Requested Prescriptions    No prescriptions requested or ordered in this encounter

## 2021-04-11 ENCOUNTER — Other Ambulatory Visit: Payer: Self-pay

## 2021-04-11 ENCOUNTER — Encounter: Payer: Self-pay | Admitting: Family

## 2021-04-11 ENCOUNTER — Ambulatory Visit (INDEPENDENT_AMBULATORY_CARE_PROVIDER_SITE_OTHER): Payer: Medicare Other | Admitting: Family

## 2021-04-11 VITALS — BP 170/100 | HR 77 | Temp 98.0°F | Ht 69.0 in | Wt 148.0 lb

## 2021-04-11 DIAGNOSIS — I10 Essential (primary) hypertension: Secondary | ICD-10-CM | POA: Diagnosis not present

## 2021-04-11 MED ORDER — AMLODIPINE BESYLATE 5 MG PO TABS: 5.0000 mg | ORAL_TABLET | Freq: Every day | ORAL | 0 refills | Status: DC

## 2021-04-11 NOTE — Progress Notes (Signed)
Peter Lam is a 75 y.o. male with the following history as recorded in EpicCare:  Patient Active Problem List   Diagnosis Date Noted  . Atrial flutter ([place]) 02/16/2018  . Prostate cancer ([place]) 07/05/2017  . Hx of adenomatous colonic polyps 05/07/2017  . FH: diabetes mellitus 05/25/2014  . Health maintenance examination 03/16/2013  . PSA, INCREASED 02/27/2009  . Hyperlipidemia 08/30/2008  . HYPERTENSION, BENIGN ESSENTIAL, LABILE 08/12/2007  . BPH (benign prostatic hypertrophy) with urinary obstruction 08/12/2007    Current Outpatient Medications  Medication Sig Dispense Refill  . amLODipine (NORVASC) 5 MG tablet Take 1 tablet (5 mg total) by mouth at bedtime. 90 tablet 0  . losartan (COZAAR) 100 MG tablet Take 1 tablet (100 mg total) by mouth daily. 90 tablet 3  . metoprolol tartrate (LOPRESSOR) 50 MG tablet Take 1 tablet (50 mg total) by mouth 2 (two) times daily. 180 tablet 1   No current facility-administered medications for this visit.    Allergies: Ace inhibitors and Diltiazem hcl  Past Medical History:  Diagnosis Date  . Arthritis   . BPH (benign prostatic hypertrophy) with urinary obstruction    followed by Dr. Jeffie Pollock at [facility]  . Cancer [facility])    Prostate cancer  . GERD (gastroesophageal reflux disease)   . Hx of adenomatous colonic polyps 05/07/2017  . Hypertension   . Inguinal hernia   . Prostate cancer [facility])     Past Surgical History:  Procedure Laterality Date  . INGUINAL HERNIA REPAIR  01/15/2012   Procedure: HERNIA REPAIR INGUINAL ADULT;  Surgeon: Earnstine Regal, MD;  Location: [place];  Service: General;  Laterality: Left;  Left inguinal hernia with mesh   . LYMPHADENECTOMY Bilateral 09/06/2017   Procedure: PELVIC LYMPHADENECTOMY;  Surgeon: Raynelle Bring, MD;  [facility];  Service: Urology;  Laterality: Bilateral;  . MULTIPLE TOOTH EXTRACTIONS    . PROSTATE BIOPSY    . ROBOT ASSISTED LAPAROSCOPIC RADICAL PROSTATECTOMY N/A  09/06/2017   Procedure: XI ROBOTIC ASSISTED LAPAROSCOPIC RADICAL PROSTATECTOMY LEVEL 2;  Surgeon: Raynelle Bring, MD;  [facility];  Service: Urology;  Laterality: N/A;  . TONSILLECTOMY AND ADENOIDECTOMY      Family History  Problem Relation Age of Onset  . Alzheimer's disease Mother   . Hypertension Father   . Hypertension Brother   . Cancer Maternal Uncle        colon  . Colon cancer Maternal Uncle   . Hypertension Sister   . Throat cancer Other   . Cancer Maternal Uncle        throat  . Cancer Maternal Uncle        prostate    Social History   Tobacco Use  . Smoking status: Former Smoker    Packs/day: 1.00    Years: 20.00    Pack years: 20.00    Types: Cigarettes    Quit date: 12/07/1973    Years since quitting: 47.3  . Smokeless tobacco: Never Used  Substance Use Topics  . Alcohol use: No    Subjective:  1 month follow up on hypertension; accompanied by son; is currently taking Losartan 100 mg and Metoprolol 100 mg bid;  Notes that stress level is continuing to stay elevated- planning to look for new home; feels that the stress is causing his blood pressure to be elevated;  Denies any chest pain, shortness of breath, blurred vision or headache; admits he is not sleeping well;      Objective:  Vitals:   04/11/21 0843  BP: (!) 170/100  Pulse: 77  Temp: 98 F (36.7 C)  TempSrc: Oral  SpO2: 99%  Weight: 148 lb (67.1 kg)  Height: 5\' 9"  (1.753 m)    General: Well developed, well nourished, in no acute distress  Skin : Warm and dry.  Head: Normocephalic and atraumatic  Lungs: Respirations unlabored; clear to auscultation bilaterally without wheeze, rales, rhonchi  CVS exam: normal rate and regular rhythm.  Neurologic: Alert and oriented; speech intact; face symmetrical; moves all extremities well; CNII-XII intact without focal deficit   Assessment:  1. HYPERTENSION, BENIGN ESSENTIAL, LABILE     Plan:  Uncontrolled; add Amlodipine 5 mg to evening dose of  Metoprolol; continue Losartan and Metoprolol in the am; Follow up in 2 months;   Return in about 2 months (around 06/11/2021).  No orders of the defined types were placed in this encounter.   Requested Prescriptions   Signed Prescriptions Disp Refills  . amLODipine (NORVASC) 5 MG tablet 90 tablet 0    Sig: Take 1 tablet (5 mg total) by mouth at bedtime.

## 2021-06-12 ENCOUNTER — Encounter: Payer: Self-pay | Admitting: Family

## 2021-06-12 ENCOUNTER — Other Ambulatory Visit: Payer: Self-pay

## 2021-06-12 ENCOUNTER — Ambulatory Visit (INDEPENDENT_AMBULATORY_CARE_PROVIDER_SITE_OTHER): Payer: Medicare Other | Admitting: Family

## 2021-06-12 DIAGNOSIS — I10 Essential (primary) hypertension: Secondary | ICD-10-CM

## 2021-06-12 MED ORDER — METOPROLOL TARTRATE 50 MG PO TABS: 50.0000 mg | ORAL_TABLET | Freq: Two times a day (BID) | ORAL | 1 refills | Status: DC

## 2021-06-12 MED ORDER — AMLODIPINE BESYLATE 10 MG PO TABS: 10.0000 mg | ORAL_TABLET | Freq: Every day | ORAL | 1 refills | Status: DC

## 2021-06-12 NOTE — Progress Notes (Signed)
Peter Lam is a 75 y.o. male with the following history as recorded in EpicCare:  Patient Active Problem List   Diagnosis Date Noted   Atrial flutter ([place]) 02/16/2018   Prostate cancer ([place]) 07/05/2017   Hx of adenomatous colonic polyps 05/07/2017   FH: diabetes mellitus 05/25/2014   Health maintenance examination 03/16/2013   PSA, INCREASED 02/27/2009   Hyperlipidemia 08/30/2008   HYPERTENSION, BENIGN ESSENTIAL, LABILE 08/12/2007   BPH (benign prostatic hypertrophy) with urinary obstruction 08/12/2007    Current Outpatient Medications  Medication Sig Dispense Refill   losartan (COZAAR) 100 MG tablet Take 1 tablet (100 mg total) by mouth daily. 90 tablet 3   amLODipine (NORVASC) 10 MG tablet Take 1 tablet (10 mg total) by mouth at bedtime. 90 tablet 1   metoprolol tartrate (LOPRESSOR) 50 MG tablet Take 1 tablet (50 mg total) by mouth 2 (two) times daily. 180 tablet 1   No current facility-administered medications for this visit.    Allergies: Ace inhibitors and Diltiazem hcl  Past Medical History:  Diagnosis Date   Arthritis    BPH (benign prostatic hypertrophy) with urinary obstruction    followed by Dr. Jeffie Pollock at [facility]   Cancer [facility])    Prostate cancer   GERD (gastroesophageal reflux disease)    Hx of adenomatous colonic polyps 05/07/2017   Hypertension    Inguinal hernia    Prostate cancer [facility])     Past Surgical History:  Procedure Laterality Date   INGUINAL HERNIA REPAIR  01/15/2012   Procedure: HERNIA REPAIR INGUINAL ADULT;  Surgeon: Earnstine Regal, MD;  Location: [place];  Service: General;  Laterality: Left;  Left inguinal hernia with mesh    LYMPHADENECTOMY Bilateral 09/06/2017   Procedure: PELVIC LYMPHADENECTOMY;  Surgeon: Raynelle Bring, MD;  [facility];  Service: Urology;  Laterality: Bilateral;   MULTIPLE TOOTH EXTRACTIONS     PROSTATE BIOPSY     ROBOT ASSISTED LAPAROSCOPIC RADICAL PROSTATECTOMY N/A 09/06/2017   Procedure: XI  ROBOTIC ASSISTED LAPAROSCOPIC RADICAL PROSTATECTOMY LEVEL 2;  Surgeon: Raynelle Bring, MD;  [facility];  Service: Urology;  Laterality: N/A;   TONSILLECTOMY AND ADENOIDECTOMY      Family History  Problem Relation Age of Onset   Alzheimer's disease Mother    Hypertension Father    Hypertension Brother    Cancer Maternal Uncle        colon   Colon cancer Maternal Uncle    Hypertension Sister    Throat cancer Other    Cancer Maternal Uncle        throat   Cancer Maternal Uncle        prostate    Social History   Tobacco Use   Smoking status: Former    Packs/day: 1.00    Years: 20.00    Pack years: 20.00    Types: Cigarettes    Quit date: 12/07/1973    Years since quitting: 47.5   Smokeless tobacco: Never  Substance Use Topics   Alcohol use: No    Subjective:  2 month follow up on hypertension; accompanied by his son; is taking medication as prescribed; notes he is feeling much better as far as his living situation is concerned; he is also going back to church and enjoying being with his friends; Denies any chest pain, shortness of breath, blurred vision or headache.    Objective:  Vitals:   06/12/21 0817 06/12/21 0821  BP: (!) 170/100 (!) 156/100  Pulse: 65  Temp: 98.1 F (36.7 C)   TempSrc: Oral   SpO2: 99%   Weight: 147 lb 12.8 oz (67 kg)   Height: 5\' 8"  (1.727 m)     General: Well developed, well nourished, in no acute distress  Skin : Warm and dry.  Head: Normocephalic and atraumatic  Lungs: Respirations unlabored;  Neurologic: Alert and oriented; speech intact; face symmetrical; moves all extremities well; CNII-XII intact without focal deficit   Assessment:  1. HYPERTENSION, BENIGN ESSENTIAL, LABILE     Plan:   Increase Amlodipine to 10 mg daily; continue other medications as prescribed; follow up in 3 months;  This visit occurred during the SARS-CoV-2 public health emergency.  Safety protocols were in place, including screening questions prior to  the visit, additional usage of staff PPE, and extensive cleaning of exam room while observing appropriate contact time as indicated for disinfecting solutions.    Return in about 3 months (around 09/12/2021).  No orders of the defined types were placed in this encounter.   Requested Prescriptions   Signed Prescriptions Disp Refills   amLODipine (NORVASC) 10 MG tablet 90 tablet 1    Sig: Take 1 tablet (10 mg total) by mouth at bedtime.   metoprolol tartrate (LOPRESSOR) 50 MG tablet 180 tablet 1    Sig: Take 1 tablet (50 mg total) by mouth 2 (two) times daily.

## 2021-09-12 ENCOUNTER — Other Ambulatory Visit: Payer: Self-pay

## 2021-09-12 ENCOUNTER — Ambulatory Visit (INDEPENDENT_AMBULATORY_CARE_PROVIDER_SITE_OTHER): Payer: Medicare Other | Admitting: Family

## 2021-09-12 ENCOUNTER — Encounter: Payer: Self-pay | Admitting: Family

## 2021-09-12 VITALS — BP 122/84 | HR 83 | Temp 97.6°F | Ht 69.0 in | Wt 142.6 lb

## 2021-09-12 DIAGNOSIS — E782 Mixed hyperlipidemia: Secondary | ICD-10-CM

## 2021-09-12 DIAGNOSIS — Z23 Encounter for immunization: Secondary | ICD-10-CM

## 2021-09-12 DIAGNOSIS — I10 Essential (primary) hypertension: Secondary | ICD-10-CM

## 2021-09-12 MED ORDER — AMLODIPINE BESYLATE 10 MG PO TABS: 10.0000 mg | ORAL_TABLET | Freq: Every day | ORAL | 1 refills | Status: DC

## 2021-09-12 MED ORDER — METOPROLOL TARTRATE 50 MG PO TABS: 50.0000 mg | ORAL_TABLET | Freq: Two times a day (BID) | ORAL | 1 refills | Status: DC

## 2021-09-12 NOTE — Progress Notes (Signed)
Peter Lam is a 75 y.o. male with the following history as recorded in EpicCare:  Patient Active Problem List   Diagnosis Date Noted   Atrial flutter ([place]) 02/16/2018   Prostate cancer ([place]) 07/05/2017   Hx of adenomatous colonic polyps 05/07/2017   FH: diabetes mellitus 05/25/2014   Health maintenance examination 03/16/2013   PSA, INCREASED 02/27/2009   Hyperlipidemia 08/30/2008   HYPERTENSION, BENIGN ESSENTIAL, LABILE 08/12/2007   BPH (benign prostatic hypertrophy) with urinary obstruction 08/12/2007    Current Outpatient Medications  Medication Sig Dispense Refill   losartan (COZAAR) 100 MG tablet Take 1 tablet (100 mg total) by mouth daily. 90 tablet 3   amLODipine (NORVASC) 10 MG tablet Take 1 tablet (10 mg total) by mouth at bedtime. 90 tablet 1   metoprolol tartrate (LOPRESSOR) 50 MG tablet Take 1 tablet (50 mg total) by mouth 2 (two) times daily. 180 tablet 1   No current facility-administered medications for this visit.    Allergies: Ace inhibitors and Diltiazem hcl  Past Medical History:  Diagnosis Date   Arthritis    BPH (benign prostatic hypertrophy) with urinary obstruction    followed by Dr. Jeffie Pollock at [facility]   Cancer [facility])    Prostate cancer   GERD (gastroesophageal reflux disease)    Hx of adenomatous colonic polyps 05/07/2017   Hypertension    Inguinal hernia    Prostate cancer [facility])     Past Surgical History:  Procedure Laterality Date   INGUINAL HERNIA REPAIR  01/15/2012   Procedure: HERNIA REPAIR INGUINAL ADULT;  Surgeon: Earnstine Regal, MD;  Location: [place];  Service: General;  Laterality: Left;  Left inguinal hernia with mesh    LYMPHADENECTOMY Bilateral 09/06/2017   Procedure: PELVIC LYMPHADENECTOMY;  Surgeon: Raynelle Bring, MD;  [facility];  Service: Urology;  Laterality: Bilateral;   MULTIPLE TOOTH EXTRACTIONS     PROSTATE BIOPSY     ROBOT ASSISTED LAPAROSCOPIC RADICAL PROSTATECTOMY N/A 09/06/2017   Procedure: XI  ROBOTIC ASSISTED LAPAROSCOPIC RADICAL PROSTATECTOMY LEVEL 2;  Surgeon: Raynelle Bring, MD;  [facility];  Service: Urology;  Laterality: N/A;   TONSILLECTOMY AND ADENOIDECTOMY      Family History  Problem Relation Age of Onset   Alzheimer's disease Mother    Hypertension Father    Hypertension Brother    Cancer Maternal Uncle        colon   Colon cancer Maternal Uncle    Hypertension Sister    Throat cancer Other    Cancer Maternal Uncle        throat   Cancer Maternal Uncle        prostate    Social History   Tobacco Use   Smoking status: Former    Packs/day: 1.00    Years: 20.00    Pack years: 20.00    Types: Cigarettes    Quit date: 12/07/1973    Years since quitting: 47.7   Smokeless tobacco: Never  Substance Use Topics   Alcohol use: No    Subjective:  3 month follow up on hypertension; notes he has been feeling good with recent medication adjustment; Denies any chest pain, shortness of breath, blurred vision or headache No acute concerns today;     Objective:  Vitals:   09/12/21 0811  BP: 122/84  Pulse: 83  Temp: 97.6 F (36.4 C)  TempSrc: Oral  SpO2: 99%  Weight: 142 lb 9.6 oz (64.7 kg)  Height: 5\' 9"  (1.753 m)  General: Well developed, well nourished, in no acute distress  Skin : Warm and dry.  Head: Normocephalic and atraumatic  Lungs: Respirations unlabored; clear to auscultation bilaterally without wheeze, rales, rhonchi  CVS exam: normal rate and regular rhythm.  Neurologic: Alert and oriented; speech intact; face symmetrical; moves all extremities well; CNII-XII intact without focal deficit   Assessment:  1. HYPERTENSION, BENIGN ESSENTIAL, LABILE   2. Need for influenza vaccination   3. Mixed hyperlipidemia     Plan:  Stable; good response to same medication; refills updated; Flu shot given; Discussed updating labs and considering medication; he defers at this time- agrees to update labs in 6 months;   This visit occurred during the  SARS-CoV-2 public health emergency.  Safety protocols were in place, including screening questions prior to the visit, additional usage of staff PPE, and extensive cleaning of exam room while observing appropriate contact time as indicated for disinfecting solutions.    Return in about 6 months (around 03/13/2022).  Orders Placed This Encounter  Procedures   Flu Vaccine QUAD High Dose(Fluad)    Requested Prescriptions   Signed Prescriptions Disp Refills   amLODipine (NORVASC) 10 MG tablet 90 tablet 1    Sig: Take 1 tablet (10 mg total) by mouth at bedtime.   metoprolol tartrate (LOPRESSOR) 50 MG tablet 180 tablet 1    Sig: Take 1 tablet (50 mg total) by mouth 2 (two) times daily.

## 2022-03-05 ENCOUNTER — Telehealth: Payer: Self-pay | Admitting: Family

## 2022-03-05 NOTE — Telephone Encounter (Signed)
I spoke with patient to schedule his AWV.  He stated he has changed PCP to someone closer to his home.  Patient also wanted me to cancel is 04/10/22 appt with Mickel Baas ?

## 2022-03-13 ENCOUNTER — Ambulatory Visit: Payer: Medicare Other | Admitting: Family

## 2022-04-10 ENCOUNTER — Ambulatory Visit: Payer: Medicare Other | Admitting: Family

## 2024-09-11 ENCOUNTER — Ambulatory Visit (INDEPENDENT_AMBULATORY_CARE_PROVIDER_SITE_OTHER): Payer: Self-pay | Admitting: Orthopedic Surgery

## 2024-09-11 ENCOUNTER — Encounter: Payer: Self-pay | Admitting: Orthopedic Surgery

## 2024-09-11 VITALS — BP 152/92 | HR 80 | Temp 97.1°F | Resp 16 | Ht 69.0 in | Wt 128.4 lb

## 2024-09-11 DIAGNOSIS — I10 Essential (primary) hypertension: Secondary | ICD-10-CM | POA: Diagnosis not present

## 2024-09-11 DIAGNOSIS — Z139 Encounter for screening, unspecified: Secondary | ICD-10-CM | POA: Diagnosis not present

## 2024-09-11 DIAGNOSIS — E782 Mixed hyperlipidemia: Secondary | ICD-10-CM

## 2024-09-11 DIAGNOSIS — R413 Other amnesia: Secondary | ICD-10-CM

## 2024-09-11 DIAGNOSIS — F03B3 Unspecified dementia, moderate, with mood disturbance: Secondary | ICD-10-CM | POA: Diagnosis not present

## 2024-09-11 DIAGNOSIS — R636 Underweight: Secondary | ICD-10-CM | POA: Diagnosis not present

## 2024-09-11 DIAGNOSIS — Z8546 Personal history of malignant neoplasm of prostate: Secondary | ICD-10-CM

## 2024-09-11 DIAGNOSIS — I4892 Unspecified atrial flutter: Secondary | ICD-10-CM

## 2024-09-11 DIAGNOSIS — R4689 Other symptoms and signs involving appearance and behavior: Secondary | ICD-10-CM

## 2024-09-11 MED ORDER — LOSARTAN POTASSIUM 50 MG PO TABS: 50.0000 mg | ORAL_TABLET | Freq: Every day | ORAL | 1 refills | Status: DC

## 2024-09-11 MED ORDER — AMLODIPINE BESYLATE 5 MG PO TABS: 5.0000 mg | ORAL_TABLET | Freq: Every day | ORAL | 1 refills | Status: DC

## 2024-09-11 NOTE — Patient Instructions (Signed)
 2 medications ordered for high blood pressure  Please have family remind patient of taking medication  Family input for next appointment would be helpful

## 2024-09-11 NOTE — Progress Notes (Addendum)
 Careteam: Patient Care Team: Gil Greig BRAVO, NP as PCP - General (Adult Health Nurse Practitioner)  Seen by: Greig Gil, AGNP-C  PLACE OF SERVICE:  [facility]  Advanced Directive information Does Patient Have a Medical Advance Directive?: No, Would patient like information on creating a medical advance directive?: No - Patient declined  Allergies  Allergen Reactions   Ace Inhibitors Other (See Comments)    Cough   Diltiazem Hcl Other (See Comments)    Exacerbated acid reflux.    Chief Complaint  Patient presents with   Establish Care    New patient.     HPI: Patient is a 78 y.o. male seen today to establish at [facility]   Discussed the use of AI scribe software for clinical note transcription with the patient, who gave verbal consent to proceed.  History of Present Illness   Peter Lam is a 78 year old male with hypertension who presents with high blood pressure and memory concerns. He is accompanied by Hildreth Sharps, his social worker from [employer].  He has a history of hypertension and high cholesterol but has not been consistent with medical follow-ups, having not seen a doctor in the past three years. He was previously prescribed amlodipine , losartan  and metoprolol  for blood pressure. No past statin use per cart review. LDL was noted to be 134 05/2010.   He describes his living situation as challenging, with no utilities at home for at least a year and a half. He lives alone in a house and relies on eating out as he cannot cook at home. He has a supportive family who provides him with food regularly. He is able to perform activities of daily living such as feeding, dressing, and bathing himself.  He has a significant smoking history, having smoked a pack a day for thirty years before quitting. He denies current smoking, alcohol use, or drug use. He engages in regular physical activity with walking. He does not drive.   He has some memory  concerns, as noted by his child psychotherapist and family. He sometimes forgets recent events, such as attending church, and has difficulty recalling dates and times. He denies having any memory problems himself but acknowledges that he does not use any memory aids like calendars or notes. His family is concerned about his memory and has reported instances of confusion. Social worker reports intermittent agitation during her visits. He will not let her go inside his home.     Colonoscopy 2018  Unable to provide family history  Widowed. No Children. Past work in holiday representative.   He has been given eviction notice. Social work is requesting FL2 to be prepared.    Review of Systems:  Review of Systems  Unable to perform ROS: Mental acuity    Past Medical History:  Diagnosis Date   Arthritis    BPH (benign prostatic hypertrophy) with urinary obstruction    followed by Dr. Watt at [facility]   [facility]cer [facility])    Prostate cancer   GERD (gastroesophageal reflux disease)    Hx of adenomatous colonic polyps 05/07/2017   Hypertension    Inguinal hernia    Prostate cancer [facility])    Past Surgical History:  Procedure Laterality Date   INGUINAL HERNIA REPAIR  01/15/2012   Procedure: HERNIA REPAIR INGUINAL ADULT;  Surgeon: Krystal CHRISTELLA Spinner, MD;  Location: [facility];  Service: General;  Laterality: Left;  Left inguinal hernia with mesh  LYMPHADENECTOMY Bilateral 09/06/2017   Procedure: PELVIC LYMPHADENECTOMY;  Surgeon: Renda Glance, MD;  [facility];  Service: Urology;  Laterality: Bilateral;   MULTIPLE TOOTH EXTRACTIONS     PROSTATE BIOPSY     ROBOT ASSISTED LAPAROSCOPIC RADICAL PROSTATECTOMY N/A 09/06/2017   Procedure: XI ROBOTIC ASSISTED LAPAROSCOPIC RADICAL PROSTATECTOMY LEVEL 2;  Surgeon: Renda Glance, MD;  [facility];  Service: Urology;  Laterality: N/A;   TONSILLECTOMY AND ADENOIDECTOMY     Social History:   reports that he quit smoking about 50 years ago. His  smoking use included cigarettes. He started smoking about 70 years ago. He has a 20 pack-year smoking history. He has never used smokeless tobacco. He reports that he does not drink alcohol and does not use drugs.  Family History  Problem Relation Age of Onset   Alzheimer's disease Mother    Hypertension Father    Hypertension Brother    Cancer Maternal Uncle        colon   Colon cancer Maternal Uncle    Hypertension Sister    Throat cancer Other    Cancer Maternal Uncle        throat   Cancer Maternal Uncle        prostate    Medications: Patient's Medications  New Prescriptions   No medications on file  Previous Medications   AMLODIPINE  (NORVASC ) 10 MG TABLET    Take 1 tablet (10 mg total) by mouth at bedtime.   LOSARTAN  (COZAAR ) 100 MG TABLET    Take 1 tablet (100 mg total) by mouth daily.   METOPROLOL  TARTRATE (LOPRESSOR ) 50 MG TABLET    Take 1 tablet (50 mg total) by mouth 2 (two) times daily.  Modified Medications   No medications on file  Discontinued Medications   No medications on file    Physical Exam:  Vitals:   09/11/24 0957  BP: (!) 182/140  Pulse: 80  Resp: 16  Temp: (!) 97.1 F (36.2 C)  SpO2: 99%  Weight: 128 lb 6.4 oz (58.2 kg)  Height: 5' 9 (1.753 m)   Body mass index is 18.96 kg/m. Wt Readings from Last 3 Encounters:  09/11/24 128 lb 6.4 oz (58.2 kg)  09/12/21 142 lb 9.6 oz (64.7 kg)  06/12/21 147 lb 12.8 oz (67 kg)    Physical Exam Vitals reviewed.  Constitutional:      General: He is not in acute distress. HENT:     Head: Normocephalic.  Eyes:     General:        Right eye: No discharge.        Left eye: No discharge.  Cardiovascular:     Rate and Rhythm: Normal rate and regular rhythm.     Pulses: Normal pulses.     Heart sounds: Normal heart sounds.  Pulmonary:     Effort: Pulmonary effort is normal.     Breath sounds: Normal breath sounds.  Abdominal:     General: Bowel sounds are normal.     Palpations: Abdomen is soft.   Musculoskeletal:     Cervical back: Neck supple.     Right lower leg: No edema.     Left lower leg: No edema.  Skin:    General: Skin is warm.     Capillary Refill: Capillary refill takes less than 2 seconds.  Neurological:     General: No focal deficit present.     Mental Status: He is alert and oriented to person, place, and time.  Psychiatric:        Mood and Affect: Mood normal.     Labs reviewed: Basic Metabolic Panel: No results for input(s): NA, K, CL, CO2, GLUCOSE, BUN, CREATININE, CALCIUM, MG, PHOS, TSH in the last 8760 hours. Liver Function Tests: No results for input(s): AST, ALT, ALKPHOS, BILITOT, PROT, ALBUMIN in the last 8760 hours. No results for input(s): LIPASE, AMYLASE in the last 8760 hours. No results for input(s): AMMONIA in the last 8760 hours. CBC: No results for input(s): WBC, NEUTROABS, HGB, HCT, MCV, PLT in the last 8760 hours. Lipid Panel: No results for input(s): CHOL, HDL, LDLCALC, TRIG, CHOLHDL, LDLDIRECT in the last 8760 hours. TSH: No results for input(s): TSH in the last 8760 hours. A1C: Lab Results  Component Value Date   HGBA1C 5.1 05/15/2016     Assessment/Plan 1. Essential hypertension (Primary) - uncontrolled, goal< 150/90 - past use of amlodipine , losartan , metoprolol  - ? Medication non compliance x 3 years> ? Due to poor memory - will restart reduced dose amlodipine  and losartan  - BMP/ EKG future  2. Mixed hyperlipidemia - no h/o statin use per chart review - LDL was 134 05/2010  3. Moderate Dementia with mood disturbance - 6CIT score 28 today - mother had AD - 05/2010 CT head unremarkable - suspect dementia due to memory impairment and impaired daily living/problem solving skills  - some agitation per social worker  - recommend neurology consult - consider CT head and Namenda in future - Ambulatory referral to Neurology  4. Encounter for SDOH - self  neglect related to dementia - no utilities x 18 months - does not drive - followed by Adult Protective Services> working on guardianship - eviction notice served by landlord - SW considering AL/ memory care placement - will need FL2 by next visit   5. History of prostate cancer - seen Dr. Watt at [facility] - s/p radical prostatectomy 09/2017 - PSA- future  6. Atrial flutter, unspecified type (HCC) - past use of metoprolol > initially started 03/15/2018 - medication non adherence x 3 years - EKG- future - consider TSH in future  7. Underweight - BMI 18.96 - does not cook at home - family provides meals - recommend > 2000 calories daily   Future tests/labs: EKG, cbc/diff, cmp, lipid panel, PSA, vaccinations  Total time: 62 minutes. Greater than 50% of total time spent doing patient education regarding health maintenance, HTN, HLD, cognitive impairment and SDOH including symptom/medication management.     Next appt: 09/21/2024  Greig Gil BODILY  [facility] [phone]

## 2024-09-12 ENCOUNTER — Telehealth: Payer: Self-pay

## 2024-09-12 NOTE — Telephone Encounter (Signed)
 I left a detailed voicemail per sticky note left on clinical intake desk, for [employer] (social worker, APS) informing her that [facility] is complete and available for pick-up. I suggested that if Camilla preferred that we fax FL2, to return call and provide a secure fax number.

## 2024-09-21 ENCOUNTER — Ambulatory Visit: Admitting: Orthopedic Surgery

## 2024-09-28 ENCOUNTER — Encounter: Payer: Self-pay | Admitting: Orthopedic Surgery

## 2024-09-28 ENCOUNTER — Ambulatory Visit (INDEPENDENT_AMBULATORY_CARE_PROVIDER_SITE_OTHER): Admitting: Orthopedic Surgery

## 2024-09-28 VITALS — BP 160/70 | HR 103 | Temp 99.6°F | Ht 69.0 in | Wt 138.2 lb

## 2024-09-28 DIAGNOSIS — Z139 Encounter for screening, unspecified: Secondary | ICD-10-CM | POA: Diagnosis not present

## 2024-09-28 DIAGNOSIS — Z23 Encounter for immunization: Secondary | ICD-10-CM | POA: Diagnosis not present

## 2024-09-28 DIAGNOSIS — R4689 Other symptoms and signs involving appearance and behavior: Secondary | ICD-10-CM

## 2024-09-28 DIAGNOSIS — E876 Hypokalemia: Secondary | ICD-10-CM | POA: Diagnosis not present

## 2024-09-28 DIAGNOSIS — I4892 Unspecified atrial flutter: Secondary | ICD-10-CM

## 2024-09-28 DIAGNOSIS — Z8546 Personal history of malignant neoplasm of prostate: Secondary | ICD-10-CM | POA: Diagnosis not present

## 2024-09-28 DIAGNOSIS — F03B3 Unspecified dementia, moderate, with mood disturbance: Secondary | ICD-10-CM | POA: Diagnosis not present

## 2024-09-28 DIAGNOSIS — I10 Essential (primary) hypertension: Secondary | ICD-10-CM | POA: Diagnosis not present

## 2024-09-28 MED ORDER — DONEPEZIL HCL 5 MG PO TABS: 5.0000 mg | ORAL_TABLET | Freq: Every day | ORAL | 2 refills | Status: DC

## 2024-09-28 MED ORDER — AMLODIPINE BESYLATE 10 MG PO TABS: 10.0000 mg | ORAL_TABLET | Freq: Every day | ORAL | 1 refills | Status: DC

## 2024-09-28 NOTE — Patient Instructions (Signed)
 Recommend pill box for medication adherence  Please take all 3 medications every evening

## 2024-09-28 NOTE — Progress Notes (Addendum)
 Careteam: Patient Care Team: Lam Peter BRAVO, NP as PCP - General (Adult Health Nurse Practitioner)  Seen by: Peter Lam, AGNP-C  PLACE OF SERVICE:  [facility]  Advanced Directive information    Allergies  Allergen Reactions   Ace Inhibitors Other (See Comments)    Cough   Diltiazem Hcl Other (See Comments)    Exacerbated acid reflux.    Chief Complaint  Patient presents with   Follow-up     HPI: Patient is a 78 y.o. male seen today for medical management of chronic conditions.   Discussed the use of AI scribe software for clinical note transcription with the patient, who gave verbal consent to proceed.  History of Present Illness   Peter Lam is a 78 year old male with hypertension who presents with elevated blood pressure and housing concerns. He is accompanied by his sister, Peter Lam.  He is currently prescribed amlodipine  and losartan  for hypertension. Unclear if e is taking his medication today. He is followed by social work due to self neglect and suspected dementia. BP elevated today. Discussed using pill box and reminder call with family. There is a family history of high blood pressure.  He lives in a place without running water. An eviction process has been initiated by his landlord. He desires to move to a better living situation and has been in contact with a child psychotherapist to assist with finding a new place.  He has a history of high cholesterol and is concerned about his memory, which he acknowledges is poor. He has not seen a doctor in a long time and is currently living alone. He is active in his church community and has a sister living nearby who checks on him.   No chest pain or shortness of breath.   H/o prostatectomy 2018.       Review of Systems:  Review of Systems  Unable to perform ROS: Dementia    Past Medical History:  Diagnosis Date   Arthritis    BPH (benign prostatic hypertrophy) with urinary obstruction    followed by Dr. Watt at  [facility]    Prostate cancer   GERD (gastroesophageal reflux disease)    Hx of adenomatous colonic polyps 05/07/2017   Hypertension    Inguinal hernia    Prostate cancer [facility])    Past Surgical History:  Procedure Laterality Date   INGUINAL HERNIA REPAIR  01/15/2012   Procedure: HERNIA REPAIR INGUINAL ADULT;  Surgeon: Peter CHRISTELLA Spinner, MD;  Location: [facility];  Service: General;  Laterality: Left;  Left inguinal hernia with mesh    LYMPHADENECTOMY Bilateral 09/06/2017   Procedure: PELVIC LYMPHADENECTOMY;  Surgeon: Peter Glance, MD;  Location: [facility];  Service: Urology;  Laterality: Bilateral;   MULTIPLE TOOTH EXTRACTIONS     PROSTATE BIOPSY     ROBOT ASSISTED LAPAROSCOPIC RADICAL PROSTATECTOMY N/A 09/06/2017   Procedure: XI ROBOTIC ASSISTED LAPAROSCOPIC RADICAL PROSTATECTOMY LEVEL 2;  Surgeon: Peter Glance, MD;  Location: [facility];  Service: Urology;  Laterality: N/A;   TONSILLECTOMY AND ADENOIDECTOMY     Social History:   reports that he quit smoking about 50 years ago. His smoking use included cigarettes. He started smoking about 70 years ago. He has a 20 pack-year smoking history. He has never used smokeless tobacco. He reports that he does not drink alcohol and does not use drugs.  Family History  Problem Relation Age of Onset   Alzheimer's disease Mother  Hypertension Father    Hypertension Brother    Cancer Maternal Uncle        colon   Colon cancer Maternal Uncle    Hypertension Sister    Throat cancer Other    Cancer Maternal Uncle        throat   Cancer Maternal Uncle        prostate    Medications: Patient's Medications  New Prescriptions   No medications on file  Previous Medications   AMLODIPINE  (NORVASC ) 5 MG TABLET    Take 1 tablet (5 mg total) by mouth daily.   LOSARTAN  (COZAAR ) 50 MG TABLET    Take 1 tablet (50 mg total) by mouth daily.  Modified Medications   No medications on file  Discontinued Medications   No  medications on file    Physical Exam:  Vitals:   09/28/24 1319 09/28/24 1328  BP: (!) 160/70 (!) 160/70  Pulse: (!) 103 (!) 103  Temp: 99.6 F (37.6 C) 99.6 F (37.6 C)  SpO2: 98% 98%  Weight: 138 lb 3.2 oz (62.7 kg) 138 lb 3.2 oz (62.7 kg)  Height: 5' 9 (1.753 m) 5' 9 (1.753 m)   Body mass index is 20.41 kg/m. Wt Readings from Last 3 Encounters:  09/28/24 138 lb 3.2 oz (62.7 kg)  09/11/24 128 lb 6.4 oz (58.2 kg)  09/12/21 142 lb 9.6 oz (64.7 kg)    Physical Exam Vitals reviewed.  Constitutional:      General: He is not in acute distress. HENT:     Head: Normocephalic.  Eyes:     General:        Right eye: No discharge.        Left eye: No discharge.  Cardiovascular:     Rate and Rhythm: Normal rate and regular rhythm.     Pulses: Normal pulses.     Heart sounds: Normal heart sounds.  Pulmonary:     Effort: Pulmonary effort is normal.     Breath sounds: Normal breath sounds.  Abdominal:     General: Bowel sounds are normal. There is no distension.     Palpations: Abdomen is soft.     Tenderness: There is no abdominal tenderness.  Musculoskeletal:     Cervical back: Neck supple.     Right lower leg: No edema.     Left lower leg: No edema.  Skin:    General: Skin is warm.     Capillary Refill: Capillary refill takes less than 2 seconds.  Neurological:     General: No focal deficit present.     Mental Status: He is alert and oriented to person, place, and time.  Psychiatric:        Mood and Affect: Mood normal. Affect is angry.     Comments: Intermittent agitation during encounter     Labs reviewed: Basic Metabolic Panel: No results for input(s): NA, K, CL, CO2, GLUCOSE, BUN, CREATININE, CALCIUM, MG, PHOS, TSH in the last 8760 hours. Liver Function Tests: No results for input(s): AST, ALT, ALKPHOS, BILITOT, PROT, ALBUMIN in the last 8760 hours. No results for input(s): LIPASE, AMYLASE in the last 8760 hours. No  results for input(s): AMMONIA in the last 8760 hours. CBC: No results for input(s): WBC, NEUTROABS, HGB, HCT, MCV, PLT in the last 8760 hours. Lipid Panel: No results for input(s): CHOL, HDL, LDLCALC, TRIG, CHOLHDL, LDLDIRECT in the last 8760 hours. TSH: No results for input(s): TSH in the last 8760 hours. A1C: Lab Results  Component Value Date   HGBA1C 5.1 05/15/2016     Assessment/Plan 1. Immunization due (Primary) - Flu vaccine HIGH DOSE PF(Fluzone Trivalent)  2. Essential hypertension - BUN/creat 5/0.90 09/28/2024 - uncontrolled, goal < 150/90 - not taking consistently -  will increase amlodipine  to 10 mg daily  - cont losartan  - will need EKG in future - amLODipine  (NORVASC ) 10 MG tablet; Take 1 tablet (10 mg total) by mouth daily.  Dispense: 90 tablet; Refill: 1 - CBC with Differential/Platelet - Complete Metabolic Panel with eGFR  3. Moderate dementia with mood disturbance, unspecified dementia type (HCC) - ongoing  - 6CIT score 28 09/11/2024 - mother had AD - scheduled to see neurology - will start aricept for behaviors due to agitation  - donepezil (ARICEPT) 5 MG tablet; Take 1 tablet (5 mg total) by mouth at bedtime.  Dispense: 90 tablet; Refill: 2  4. Encounter for SDOH - self neglect associated with dementia -  no utilities x 18 months - does not drive - followed by Adult Protective Services> working on guardianship - eviction notice served by landlord> FL2 given to social worker> looking for placement - SW considering AL/ memory care placement - will need FL2 by next visit   5. Atrial flutter, unspecified type (HCC) - HR< 100 - EKG- future when behaviors improve   6. History of prostate cancer - s/p radical prostatectomy 09/2017  - PSA> normal  7. Hypokalemia - K+ 3.3 09/28/2024 - start KCL 20 meq po daily x 4 days   Total time: 38 minutes. Greater than 50% of total time spent doing patient education regarding health  maintenance, HTN, medication adherence, dementia and elevated PSA including symptom/medication management.    Next appt: 11/09/2024  Peter Cluster, ELNITA  [facility] [phone]

## 2024-09-29 ENCOUNTER — Ambulatory Visit: Payer: Self-pay | Admitting: Orthopedic Surgery

## 2024-09-29 DIAGNOSIS — E876 Hypokalemia: Secondary | ICD-10-CM

## 2024-09-29 LAB — COMPREHENSIVE METABOLIC PANEL WITH GFR
AG Ratio: 2.1 (calc) (ref 1.0–2.5)
ALT: 11 U/L (ref 9–46)
AST: 11 U/L (ref 10–35)
Albumin: 4 g/dL (ref 3.6–5.1)
Alkaline phosphatase (APISO): 90 U/L (ref 35–144)
BUN/Creatinine Ratio: 6 (calc) (ref 6–22)
BUN: 5 mg/dL — ABNORMAL LOW (ref 7–25)
CO2: 31 mmol/L (ref 20–32)
Calcium: 8.9 mg/dL (ref 8.6–10.3)
Chloride: 105 mmol/L (ref 98–110)
Creat: 0.9 mg/dL (ref 0.70–1.28)
Globulin: 1.9 g/dL (ref 1.9–3.7)
Glucose, Bld: 105 mg/dL — ABNORMAL HIGH (ref 65–99)
Potassium: 3.3 mmol/L — ABNORMAL LOW (ref 3.5–5.3)
Sodium: 144 mmol/L (ref 135–146)
Total Bilirubin: 0.5 mg/dL (ref 0.2–1.2)
Total Protein: 5.9 g/dL — ABNORMAL LOW (ref 6.1–8.1)
eGFR: 89 mL/min/1.73m2 (ref >=60)

## 2024-09-29 LAB — CBC WITH DIFFERENTIAL/PLATELET
Absolute Lymphocytes: 961 {cells}/uL (ref 850–3900)
Absolute Monocytes: 491 {cells}/uL (ref 200–950)
Basophils Absolute: 32 {cells}/uL (ref 0–200)
Basophils Relative: 0.6 %
Eosinophils Absolute: 167 {cells}/uL (ref 15–500)
Eosinophils Relative: 3.1 %
HCT: 41.1 % (ref 38.5–50.0)
Hemoglobin: 13.5 g/dL (ref 13.2–17.1)
MCH: 30.8 pg (ref 27.0–33.0)
MCHC: 32.8 g/dL (ref 32.0–36.0)
MCV: 93.8 fL (ref 80.0–100.0)
MPV: 12.2 fL (ref 7.5–12.5)
Monocytes Relative: 9.1 %
Neutro Abs: 3748 {cells}/uL (ref 1500–7800)
Neutrophils Relative %: 69.4 %
Platelets: 164 Thousand/uL (ref 140–400)
RBC: 4.38 Million/uL (ref 4.20–5.80)
RDW: 13.3 % (ref 11.0–15.0)
Total Lymphocyte: 17.8 %
WBC: 5.4 Thousand/uL (ref 3.8–10.8)

## 2024-09-29 LAB — PSA: PSA: <0.04 ng/mL (ref <=4.00)

## 2024-09-29 MED ORDER — POTASSIUM CHLORIDE CRYS ER 20 MEQ PO TBCR: 20.0000 meq | EXTENDED_RELEASE_TABLET | Freq: Every day | ORAL | 0 refills | Status: DC

## 2024-10-02 ENCOUNTER — Ambulatory Visit: Admitting: Neurology

## 2024-10-02 ENCOUNTER — Encounter: Payer: Self-pay | Admitting: Neurology

## 2024-10-02 VITALS — BP 143/80 | HR 96 | Ht 70.0 in | Wt 133.6 lb

## 2024-10-02 DIAGNOSIS — F02C0 Dementia in other diseases classified elsewhere, severe, without behavioral disturbance, psychotic disturbance, mood disturbance, and anxiety: Secondary | ICD-10-CM

## 2024-10-02 DIAGNOSIS — F03918 Unspecified dementia, unspecified severity, with other behavioral disturbance: Secondary | ICD-10-CM | POA: Diagnosis not present

## 2024-10-02 DIAGNOSIS — F0150 Vascular dementia without behavioral disturbance: Secondary | ICD-10-CM | POA: Diagnosis not present

## 2024-10-02 DIAGNOSIS — I4892 Unspecified atrial flutter: Secondary | ICD-10-CM

## 2024-10-02 NOTE — Progress Notes (Addendum)
 Provider:  [name], MD  Primary Care Physician:  Lam Peter BRAVO, NP [phone] [address] [place] [address]     Referring Provider: Gil, Peter BRAVO, Np [address] [place],  [place] [address]          Chief Complaint according to patient   Patient presents with:     New Patient (Initial Visit)      Patient is herewith brother for memory - here for dementia evaluation MMSE - 11  living now with is brother since last week- had not seen by primary care in 5 years until last week. Peter Lam        HISTORY OF PRESENT ILLNESS:  Peter Lam is a 78 y.o. male patient who is seen upon Peter Lam, NPs  referral:  Lam, Peter E, Np [address] [place],  [place] [address]'d referral on 10/02/2024  for an Evaluation of Dementia / Impairment in all major ADL s.  COGNITIVE CONCERNS .   Chief concern ( according to the sibling of our patient)  : Peter Lam younger  Brother reports their sister  just visited primary care with his brother, the patient , who had no medical attention for the last 3 years.  Peter Lam is a and has over the last year maybe 18 mainly lived off the his insist have brought over to his apartment.  They noticed that he was no longer able to keep the home clean, he had not up the driveline since and was dependent for an kind of month out at his home. So should be when the patient was to vacation from his.  Document and his brother allow him to move in with him.  I will out of the dignity weekend impaired him and in his case of daily living seems to have gone on for years and was only progressed for.  It happened now to the point where he was become homeless clear that he no longer handles his own banking, postal communications or appointment calendar.  So he was just seen by a new primary care physician who now referred Peter Lam here a child psychotherapist is familiar with this case, and the patient looks very slim but is cleanly dressed and well-groomed.  He  keeps eye contact he answers questions his hearing seems to be intact and his ability to draw a clock face and an image on his MMSE test show me that he is able to see well enough.  He denies any physical impairment or restriction.  Disorientation, delusions, hallucination has not been witnessed by his brother, nor agitation, but agitation had been stated by his child psychotherapist.        I have the pleasure of seeing Peter Lam on 10/02/24, a left handed male who reports difficulties with  memory, orientation.   The following examples were provided:  forget to pay bills, is on the verge of eviction, now lives with brother- never drove, widowed. No children  Med History: There have been no traumatic brain injuries, prolonged surgeries under general anesthesia, radiation or chemotherapy,  substance abuse, or mental illness.  ( Reviewed evidence  or documentation of : no PTSD, no Trauma such as TBI/ whiplash,  No  Autoimmune disorders, cancer, Vitamin deficiency,  HTN,  GERD, Mood disorders,  Current medications as listed did not contain anticholinergic substances, narcotics, benzodiazepines or sedatives.  The patient has adequate hearing  and vision ability.  No loss of smell.   Social history: Phe now  lives in a private  household with brother, was unable to maintain his own place.  He could not keep it clean not did he pay his bills. The daily routines are newly established : Brother keeps set meal times,  but he has not yet established a bedtime, or rise time.    Peter Lam has  no children,  Peter Lam is reporting independence in the following activities of daily living: activities of daily living   He had no access to fresh food in his apartement -his brother AND SISTER would bring food and he now eats regular meals.  Meals are prepared by the household.  Communication: unable to use a land line telephone, a mobile phone, or a computer. He makes  no appointments by phone or online  and keeps appointments.  Reports dependence in bill paying, banking, and files paperwork such as for insurance or taxes.  Dressing, toileting, bathing , feeding, are independent.   Ambulation :no  Falls, no  Syncope,no   ROM restriction:      Family history : Impaired cognitive function or dementia were affecting the following biological  family members:      Social history: Patient attended 12  years of HS education, worked as a emergency planning/management officer  , and reported no difficulties with learning, attention, vision or hearing at the time of his training.  He never drove.  He retired 12 years ago  There was no financial planner time.  Nicotine use; smoker  in his 20-30-40s.   ETOH use none ,  Substance use otherwise:  Caffeine intake in form of Coffee( 2 cups in AM ) Soda(  at lunch, coke. ) Tea ( /) or energy drinks.  Physical activity in form of walking. Peter Lam Chestnut and Social activities:  lives now with brother since October 2025    The referring physician has kindly provided the following clinical history information and evolution of symptoms , imaging  and test results:  Patient is a 78 y.o. male seen today to establish at [facility].    Discussed the use of AI scribe software for clinical note transcription with the patient, who gave verbal consent to proceed.   History of Present Illness   Peter Lam is a 78 year old male with hypertension who presents with high blood pressure and memory concerns. He is accompanied by Peter Lam, his social worker from Adult Education Administrator.   He has a history of hypertension and high cholesterol but has not been consistent with medical follow-ups, having not seen a doctor in the past three years. He was previously prescribed amlodipine , losartan  and metoprolol  for blood pressure. No past statin use per cart review. LDL was noted to be 134 05/2010.    He describes his living situation as challenging,  with no utilities at home for at least a year and a half. He lives alone in a house and relies on eating out as he cannot cook at home. He has a supportive family who provides him with food regularly. He is able to perform activities of daily living such as feeding, dressing, and bathing himself.   He has a significant smoking history, having smoked a pack a day for thirty years before quitting. He denies current smoking, alcohol use, or drug use. He engages in regular physical activity with walking. He does not drive.  He has some memory concerns, as noted by his child psychotherapist and family. He sometimes forgets recent events, such as attending church, and has difficulty recalling dates and times. He denies having any memory problems himself but acknowledges that he does not use any memory aids like calendars or notes. His family is concerned about his memory and has reported instances of confusion. Social worker reports intermittent agitation during her visits. He will not let her go inside his home.      Colonoscopy 2018   Unable to provide family history   Widowed. No Children. Past work in holiday representative.    He has been given eviction notice. Social work is requesting FL2 to be prepared.        Review of Systems: Out of a complete 14 system review, the patient complains of only the following symptoms, and all other reviewed systems are negative.:  See above    mini-mental status exam    10/02/2024    2:28 PM  MMSE - Mini Mental State Exam  Orientation to time 0  Orientation to Place 1  Registration 3  Attention/ Calculation 0  Recall 0  Language- name 2 objects 2  Language- repeat 0  Language- follow 3 step command 3  Language- read & follow direction 1  Write a sentence 1  Copy design 0  Total score 11      Social History   Socioeconomic History   Marital status: Widowed    Spouse name: Gwendolyn   Number of children: 1   Years of education: 11   Highest education  level: Not on file  Occupational History   Occupation: Retired   Tobacco Use   Smoking status: Former    Current packs/day: 0.00    Average packs/day: 1 pack/day for 20.0 years (20.0 ttl pk-yrs)    Types: Cigarettes    Start date: 12/07/1953    Quit date: 12/07/1973    Years since quitting: 50.8   Smokeless tobacco: Never  Vaping Use   Vaping status: Never Used  Substance and Sexual Activity   Alcohol use: No   Drug use: No   Sexual activity: Not Currently  Other Topics Concern   Not on file  Social History Narrative   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   Fun/Hobby: Spends a lot of time at church (choir), reading       1 cup of coffee or soda daily    Social Drivers of Corporate Investment Banker Strain: Not on file  Food Insecurity: Food Insecurity Present (09/11/2024)   Hunger Vital Sign    Worried About Running Out of Food in the Last Year: Often true    Ran Out of Food in the Last Year: Often true  Transportation Needs: Unmet Transportation Needs (09/11/2024)   PRAPARE - Administrator, Civil Service (Medical): Yes    Lack of Transportation (Non-Medical): Yes  Physical Activity: Not on file  Stress: Not on file  Social Connections: Moderately Isolated (09/11/2024)   Social Connection and Isolation Panel    Frequency of Communication with Friends and Family: Three times a week    Frequency of Social Gatherings with Friends and Family: Three times a week    Attends Religious Services: More than 4 times per year    Active Member of Clubs or Organizations: No    Attends Banker Meetings:  Never    Marital Status: Widowed    Family History  Problem Relation Age of Onset   Alzheimer's disease Mother    Hypertension Father    Hypertension Brother    Cancer Maternal Uncle        colon   Colon cancer Maternal Uncle    Hypertension Sister    Throat cancer Other    Cancer  Maternal Uncle        throat   Cancer Maternal Uncle        prostate    Past Medical History:  Diagnosis Date   Arthritis    BPH (benign prostatic hypertrophy) with urinary obstruction    followed by Dr. Watt at [facility]   Cancer [facility])    Prostate cancer   GERD (gastroesophageal reflux disease)    Hx of adenomatous colonic polyps 05/07/2017   Hypertension    Inguinal hernia    Prostate cancer [facility])     Past Surgical History:  Procedure Laterality Date   INGUINAL HERNIA REPAIR  01/15/2012   Procedure: HERNIA REPAIR INGUINAL ADULT;  Surgeon: Krystal CHRISTELLA Spinner, MD;  Location: [facility];  Service: General;  Laterality: Left;  Left inguinal hernia with mesh    LYMPHADENECTOMY Bilateral 09/06/2017   Procedure: PELVIC LYMPHADENECTOMY;  Surgeon: Renda Glance, MD;  Location: [facility];  Service: Urology;  Laterality: Bilateral;   MULTIPLE TOOTH EXTRACTIONS     PROSTATE BIOPSY     ROBOT ASSISTED LAPAROSCOPIC RADICAL PROSTATECTOMY N/A 09/06/2017   Procedure: XI ROBOTIC ASSISTED LAPAROSCOPIC RADICAL PROSTATECTOMY LEVEL 2;  Surgeon: Renda Glance, MD;  Location: [facility];  Service: Urology;  Laterality: N/A;   TONSILLECTOMY AND ADENOIDECTOMY       Current Outpatient Medications on File Prior to Visit  Medication Sig Dispense Refill   amLODipine  (NORVASC ) 10 MG tablet Take 1 tablet (10 mg total) by mouth daily. 90 tablet 1   amLODipine  (NORVASC ) 5 MG tablet Take 5 mg by mouth daily.     donepezil (ARICEPT) 5 MG tablet Take 1 tablet (5 mg total) by mouth at bedtime. 90 tablet 2   losartan  (COZAAR ) 50 MG tablet Take 1 tablet (50 mg total) by mouth daily. 90 tablet 1   potassium chloride  SA (KLOR-CON  M) 20 MEQ tablet Take 1 tablet (20 mEq total) by mouth daily for 4 days. 4 tablet 0   No current facility-administered medications on file prior to visit.    Allergies  Allergen Reactions   Ace Inhibitors Other (See Comments)    Cough   Diltiazem Hcl Other (See Comments)     Exacerbated acid reflux.     DIAGNOSTIC DATA (LABS, IMAGING, TESTING) - I reviewed patient records, labs, notes, testing and imaging myself where available.  Lab Results  Component Value Date   WBC 5.4 09/28/2024   HGB 13.5 09/28/2024   HCT 41.1 09/28/2024   MCV 93.8 09/28/2024   PLT 164 09/28/2024      Component Value Date/Time   NA 144 09/28/2024 1409   K 3.3 (L) 09/28/2024 1409   CL 105 09/28/2024 1409   CO2 31 09/28/2024 1409   GLUCOSE 105 (H) 09/28/2024 1409   BUN 5 (L) 09/28/2024 1409   CREATININE 0.90 09/28/2024 1409   CALCIUM 8.9 09/28/2024 1409   PROT 5.9 (L) 09/28/2024 1409   ALBUMIN 4.5 02/14/2021 0937   AST 11 09/28/2024 1409   ALT 11 09/28/2024 1409   ALKPHOS 98 02/14/2021 0937   BILITOT 0.5 09/28/2024 1409  GFRNONAA >60 09/01/2017 0824   GFRNONAA >89 06/12/2015 0859   GFRAA >60 09/01/2017 0824   GFRAA >89 06/12/2015 0859   Lab Results  Component Value Date   CHOL 200 02/14/2021   HDL 56.10 02/14/2021   LDLCALC 134 (H) 02/14/2021   LDLDIRECT 109 05/15/2016   TRIG 51.0 02/14/2021   CHOLHDL 4 02/14/2021   Lab Results  Component Value Date   HGBA1C 5.1 05/15/2016   Lab Results  Component Value Date   VITAMINB12 234 02/14/2021   Lab Results  Component Value Date   TSH 1.32 02/16/2018    PHYSICAL EXAM:  Tabular:  No data found.   Body mass index is 19.17 kg/m.   Wt Readings from Last 3 Encounters:  10/02/24 133 lb 9.6 oz (60.6 kg)  09/28/24 138 lb 3.2 oz (62.7 kg)  09/11/24 128 lb 6.4 oz (58.2 kg)     Ht Readings from Last 3 Encounters:  10/02/24 5' 10 (1.778 m)  09/28/24 5' 9 (1.753 m)  09/11/24 5' 9 (1.753 m)      Cardiovascular:  Regular rate and cardiac rhythm by pulse,  without distended neck veins. Respiratory: no tachypnoea or wheezing. .  Skin:  Without evidence of ankle edema, rash, bruising  The patient's posture was erect.   NEUROLOGIC EXAM: The patient was awake and alert, oriented to place and time.    Attention span & concentration ability appeared normal.  Speech was fluent, without dysarthria, dysphonia or aphasia, and of normal volume.     Cranial nerves:  There was no  loss of smell or taste reported  Pupils are round, equal in size and briskly reactive to light.  Funduscopic exam was deferred.  Extraocular movements in vertical and horizontal planes were intact and without nystagmus. (No Diplopia reported). Visual fields by finger perimetry are intact. Hearing was intact to soft voice.    Facial sensation intact ( fine touch).  Facial motor strength: Symmetric movement and tongue and uvula move midline.  Neck ROM: rotation, tilt and flexion /extension were observed,  shoulder shrug was symmetrical.    Motor exam:  Symmetric bulk, strength and ROM.   Muscle tone was  without cog- wheeling, and there was symmetric grip strength.   Sensory:  Fine touch and vibration were tested by tuning fork and intact OVER KNEES AND ANKLES .  Proprioception tested in the upper extremities was normal.   Coordination: The patient reported no problems with button closure and no changes to penmanship.   The Finger-to-nose maneuver was intact without evidence of ataxia, dysmetria or tremor. He used the whole hand, not the index finger.    Gait and station: Patient could rise unassisted from a seated position, with bracing, and walked without assistive device.  Stance was of wide width. The patient turned with 3-4 steps. Toe and heel walk were deferred. No limp was noted.  Arm swing was preserved.   Deep tendon reflexes: Upper extremities did show symmetric DTRs. Lower extremity DTRs were attenuated.   Babinski response was deferred   ASSESSMENT  :   Dear Lam, Peter E, Np [address] [place],  [place] [address],   Thank you for entrusting me with your patient's care.   As you know, your patient  Peter Lam , a 78 y.o. male presented here on 10-27=2025 for an Evaluation of advanced  memory decline/ advanced dementia-  upon your request .  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  Dementia of unknown subtype: MMSE in the 11/ 30 point range.  In spite of good hearing and normal vision, good ROM capacity.   Self neglect - no utilities ( none paid or none available after disconnection ? )  x 18 months - does not drive and never did  - followed by Adult Protective Services> working on guardianship - eviction notice served by landlord - SW considering AL/ memory care placement - will need FL2 by next visit   2)  Prostate cancer history ( the patient denied any history of cancer surgery, radiation or chemo) he is underweight.     My Plan is to proceed with:   I will start a basic dementia work up . Advanced treatment options  for  AD is not available to any individual with this decreased memory score.    1) [facility]NA DEMENTIA PANEL  2) ATN 3) Late onset ALZHEIMER Genetic risk  4) EEG 5) MRI brain with and without , if not already done 6 ) primary care has Started Donepezil : (unless patient has bradycardia , IBS or REM BD) at 5 mg for 90 days, then increase to 10 mg- if tolerated.    The plan is to identify cognitive domains affected by changes, to rule out brain lesions or vascular abnormalities, to obtain testing of  AD bio-markers, genetic markers and , if applicable , consolidate these findings by a PET scan.  Serial testing  by [facility] or MMSE test is needed for all patients that are currently scoring in the subjective memory loss, MCI, or mild dementia category.   A referral for neuropsychological interview and testing battery  has been ordered.  Medication to help slow cognitive decline are available but may not be tolerated, these are available in oral and patch form and their use is not specific to a single form of neurodegenerative cognitive disorder.    I plan to follow up  through our NP or  personally within 6 month.    The patient's condition requires frequent monitoring and adjustments in the treatment plan, reflecting the ongoing complexity of care.  This provider is for the named interval time the continuing focal point for associated medical /neurological needs services for this condition.   A total time of > 55  minutes consistent of a part of face to face encounter , exam and interview,  and additional preparation time for chart review was spent. Additionally, the following were reviewed: Past medical records, past medical and surgical history, family and social background, as well as relevant laboratory results, imaging findings, and medical notes, where applicable.  At today's visit, we discussed treatment options, associated risk and benefits, and engage in counseling as needed: Including, but not limited to driving safety, home safety, the benefit of routines and activities.   Memory strategies were provided in the patient education attachment.   This note was generated in part by using dictation software, and as a result, it may contain unintentional typos and errors.  Nevertheless, effort was made to accurately convey the pertinent aspects of the patient's visit.    Electronically signed by: Peter Gores, MD 10/02/2024 3:11 PM  [facility] and [facility] Board certified by The [employer] of Sleep Medicine and Diplomate of the [employer] of Sleep Medicine. Board certified In Neurology through the ABPN, Fellow of the [employer] of Neurology. [facility]@ GNA.    Tips for Healthy Aging:   Read the  MIND DIET book for nutritional information.  Regular physical activity and daylight exposure.  This lifts the mood and entrains a circadian rhythm, leading to better sleep and frees up the mind.   Walking a minimum of  20 minutes a day , if possible outdoors, preferable in a park or nature area.  Indoor exercised such as  stretching , yoga , stationary bike or stair master ( at the gym).  Read !  Keep up with daily events , news.  Maintain or establish new Hobbies , consider Volunteering, and consider becoming a member of a club, church or other community memberships and engagements.Social interactions give purpose, joy and connection- all are good for the brain. Interaction is brain jogging!    Reconsider your driving ability-    Reconsider your home : The home is a single storey ( ranch/ apartment) ?  If you live in a multistory residence, are all stairs equipped with solid handrails on both sides ?  Can any existing staircase be retrofitted with a stair lift .  Do you need an entry ramp ?   The home that is suited to aging in space has low or no barriers to enter , to reach the lavatory  ( wide doors , high toilet seat, handrails), has a no barrier shower ( no tub ) with a scold guard water temperature setting, handheld shower head , and a freestanding shower seat ( consider a plastic garden armchair !) .   And are the doors wide enough to pass with walker or wheelchair?   Is there enough space in the bedroom to reach the bed from two or better three sides.  De- clutter and arrange your home for safety: place objects at eye height , not above- not below hip height-  you should not need to use a stepstool or ladder.   No overlapping area rugs, sufficient light, and passage space free of furniture , remove breakable items from side tables, night stands.  A Kitchen stovetop powered by induction prevents injuries and fire- replace gas tops and toaster ovens.[place] Peter Gores, MD

## 2024-10-08 LAB — COMPREHENSIVE METABOLIC PANEL WITH GFR
ALT: 20 IU/L (ref 0–44)
AST: 18 IU/L (ref 0–40)
Albumin: 4.4 g/dL (ref 3.8–4.8)
Alkaline Phosphatase: 124 IU/L — ABNORMAL HIGH (ref 47–123)
BUN/Creatinine Ratio: 13 (ref 10–24)
BUN: 11 mg/dL (ref 8–27)
Bilirubin Total: 0.4 mg/dL (ref 0.0–1.2)
CO2: 25 mmol/L (ref 20–29)
Calcium: 9.1 mg/dL (ref 8.6–10.2)
Chloride: 105 mmol/L (ref 96–106)
Creatinine, Ser: 0.86 mg/dL (ref 0.76–1.27)
Globulin, Total: 2 g/dL (ref 1.5–4.5)
Glucose: 89 mg/dL (ref 70–99)
Potassium: 4.5 mmol/L (ref 3.5–5.2)
Sodium: 143 mmol/L (ref 134–144)
Total Protein: 6.4 g/dL (ref 6.0–8.5)
eGFR: 90 mL/min/1.73 (ref >59)

## 2024-10-08 LAB — CBC WITH DIFFERENTIAL/PLATELET
Basophils Absolute: 0 x10E3/uL (ref 0.0–0.2)
Basos: 0 %
EOS (ABSOLUTE): 0.2 x10E3/uL (ref 0.0–0.4)
Eos: 2 %
Hematocrit: 48.5 % (ref 37.5–51.0)
Hemoglobin: 15.7 g/dL (ref 13.0–17.7)
Immature Grans (Abs): 0 x10E3/uL (ref 0.0–0.1)
Immature Granulocytes: 0 %
Lymphocytes Absolute: 1 x10E3/uL (ref 0.7–3.1)
Lymphs: 12 %
MCH: 31.2 pg (ref 26.6–33.0)
MCHC: 32.4 g/dL (ref 31.5–35.7)
MCV: 96 fL (ref 79–97)
Monocytes Absolute: 0.6 x10E3/uL (ref 0.1–0.9)
Monocytes: 7 %
Neutrophils Absolute: 6.4 x10E3/uL (ref 1.4–7.0)
Neutrophils: 79 %
Platelets: 202 x10E3/uL (ref 150–450)
RBC: 5.03 x10E6/uL (ref 4.14–5.80)
RDW: 13.9 % (ref 11.6–15.4)
WBC: 8.2 x10E3/uL (ref 3.4–10.8)

## 2024-10-08 LAB — ATN PROFILE
A -- Beta-amyloid 42/40 Ratio: 0.1 — ABNORMAL LOW (ref >0.102)
Beta-amyloid 40: 216.7 pg/mL
Beta-amyloid 42: 21.77 pg/mL
N -- NfL, Plasma: 3.08 pg/mL (ref 0.00–6.04)
T -- p-tau181: 1.18 pg/mL — ABNORMAL HIGH (ref 0.00–0.97)

## 2024-10-08 LAB — TSH+FREE T4
Free T4: 0.93 ng/dL (ref 0.82–1.77)
TSH: 1.39 u[IU]/mL (ref 0.450–4.500)

## 2024-10-08 LAB — PROTEIN ELECTROPHORESIS, SERUM
A/G Ratio: 1.2 (ref 0.7–1.7)
Albumin ELP: 3.5 g/dL (ref 2.9–4.4)
Alpha 1: 0.3 g/dL (ref 0.0–0.4)
Alpha 2: 0.8 g/dL (ref 0.4–1.0)
Beta: 0.9 g/dL (ref 0.7–1.3)
Gamma Globulin: 0.9 g/dL (ref 0.4–1.8)
Globulin, Total: 2.9 g/dL (ref 2.2–3.9)

## 2024-10-08 LAB — SEDIMENTATION RATE: Sed Rate: 4 mm/h (ref 0–30)

## 2024-10-08 LAB — APOE ALZHEIMER'S DISEASE RISK

## 2024-10-08 LAB — VITAMIN B12: Vitamin B-12: 399 pg/mL (ref 232–1245)

## 2024-10-08 LAB — ANA W/REFLEX: Anti Nuclear Antibody (ANA): NEGATIVE

## 2024-10-09 ENCOUNTER — Ambulatory Visit: Payer: Self-pay | Admitting: Neurology

## 2024-10-10 ENCOUNTER — Ambulatory Visit: Admitting: Neurology

## 2024-10-10 DIAGNOSIS — F0150 Vascular dementia without behavioral disturbance: Secondary | ICD-10-CM

## 2024-10-10 DIAGNOSIS — I4892 Unspecified atrial flutter: Secondary | ICD-10-CM

## 2024-10-10 DIAGNOSIS — R4182 Altered mental status, unspecified: Secondary | ICD-10-CM

## 2024-10-10 DIAGNOSIS — F03918 Unspecified dementia, unspecified severity, with other behavioral disturbance: Secondary | ICD-10-CM

## 2024-10-10 DIAGNOSIS — F02C0 Dementia in other diseases classified elsewhere, severe, without behavioral disturbance, psychotic disturbance, mood disturbance, and anxiety: Secondary | ICD-10-CM

## 2024-10-10 NOTE — Telephone Encounter (Signed)
 I called the patient's brother [name] who was out in the lobby waiting on pt to have EEG. I went to the lobby and discussed the lab results as noted below by Dr Chalice. Pt's brother understands that Dr Chalice states the lab tests pont towards Alzheimer's being cause of pt's Dementia. He understands patient's memory score is too low for medications to treat the disease but he is on Donepezil which he can continue. He is to increase from 5 mg dose to 10 mg in the next 4 weeks if tolerated. I suggested to the brother for patient to f/u at next PCP visit on 12/4 and discuss increased dose at that visit. His questions were answered and he verbalized understanding and appreciation. The patient has moved in with his brother [name] and sister-in-law.

## 2024-10-10 NOTE — Telephone Encounter (Signed)
-----   Message from Athens Dohmeier sent at 10/09/2024  1:04 PM EST ----- Positive alzheimer biomarkers indicate Alzheimer's pathology is present,  explaining the DEMENTIA CAUSE.  The test shows us  that AD is likely a cause for memory loss in those  patient 's already experiencing cognitive changes.  Peter Lam's MMSE test was 11 points and he is well beyond the point of   neurological therapy but we now know that his diagnosis is Alzheimer's disease.   A follow up with neurology is not necessary.  Please continue with  Aricept and increase to 10 mg in the next  4 weeks if tolerated.   ----- Message ----- From: Rebecka Memos Lab Results In Sent: 10/03/2024   7:37 AM EST To: [name], MD

## 2024-10-17 NOTE — Procedures (Signed)
    History:  78 year old man with cognitive changes   EEG classification: Awake and drowsy  Duration: 28 minutes   Technical aspects: This EEG study was done with scalp electrodes positioned according to the 10-20 International system of electrode placement. Electrical activity was reviewed with band pass filter of 1-70Hz , sensitivity of 7 uV/mm, display speed of 69mm/sec with a 60Hz  notched filter applied as appropriate. EEG data were recorded continuously and digitally stored.   Description of the recording: The background rhythms of this recording consists of a fairly well modulated medium amplitude alpha rhythm of 9 Hz that is reactive to eye opening and closure. Present in the anterior head region is a 15-20 Hz beta activity. Photic stimulation was performed, did not show any abnormalities. Hyperventilation was also performed, did not show any abnormalities. Drowsiness was manifested by background fragmentation. No abnormal epileptiform discharges seen during this recording. There was no focal slowing. There were no electrographic seizure identified.   Abnormality: None   Impression: This is a normal awake and drowsy EEG. No evidence of interictal epileptiform discharges. Normal EEGs, however, do not rule out epilepsy.    Peter Olivo, MD [facility]

## 2024-10-18 NOTE — Progress Notes (Signed)
 Called pt's brother [name] (on [place]) and LVM asking for call back. When he calls back, please let him know that the pt's EEG of the brain was normal. No changes needed in his plan of care.

## 2024-10-23 NOTE — Telephone Encounter (Signed)
 Pt's brother [name] called back. I let him know pt's EEG is normal, no change needed in therapy. He thanked me for the call and his questions were answered.

## 2024-11-09 ENCOUNTER — Encounter: Payer: Self-pay | Admitting: Orthopedic Surgery

## 2024-11-09 ENCOUNTER — Ambulatory Visit: Payer: Self-pay | Admitting: Orthopedic Surgery

## 2024-11-09 ENCOUNTER — Other Ambulatory Visit (HOSPITAL_COMMUNITY): Payer: Self-pay

## 2024-11-09 VITALS — BP 138/88 | HR 97 | Temp 99.8°F | Ht 70.0 in | Wt 138.2 lb

## 2024-11-09 DIAGNOSIS — E876 Hypokalemia: Secondary | ICD-10-CM | POA: Diagnosis not present

## 2024-11-09 DIAGNOSIS — E782 Mixed hyperlipidemia: Secondary | ICD-10-CM | POA: Diagnosis not present

## 2024-11-09 DIAGNOSIS — Z23 Encounter for immunization: Secondary | ICD-10-CM | POA: Diagnosis not present

## 2024-11-09 DIAGNOSIS — F03B3 Unspecified dementia, moderate, with mood disturbance: Secondary | ICD-10-CM

## 2024-11-09 DIAGNOSIS — I10 Essential (primary) hypertension: Secondary | ICD-10-CM

## 2024-11-09 MED ORDER — LOSARTAN POTASSIUM 50 MG PO TABS
50.0000 mg | ORAL_TABLET | Freq: Every day | ORAL | 1 refills | Status: AC
  Filled 2024-11-09: qty 90, 90d supply, fill #0

## 2024-11-09 MED ORDER — DONEPEZIL HCL 5 MG PO TABS
5.0000 mg | ORAL_TABLET | Freq: Every day | ORAL | 2 refills | Status: AC
  Filled 2024-11-09: qty 90, 90d supply, fill #0

## 2024-11-09 MED ORDER — AMLODIPINE BESYLATE 10 MG PO TABS
10.0000 mg | ORAL_TABLET | Freq: Every day | ORAL | 1 refills | Status: AC
  Filled 2024-11-09: qty 90, 90d supply, fill #0

## 2024-11-09 MED ORDER — SERTRALINE HCL 25 MG PO TABS
25.0000 mg | ORAL_TABLET | Freq: Every day | ORAL | 3 refills | Status: AC
  Filled 2024-11-09: qty 30, 30d supply, fill #0
  Filled 2024-12-12: qty 30, 30d supply, fill #1
  Filled 2025-01-12: qty 30, 30d supply, fill #2

## 2024-11-09 NOTE — Patient Instructions (Signed)
 Please come fasting next visit so we can check cholesterol> no food 12 hours before visit> water and black are ok to have in the morning   Merry Christmas and Happy New Year!!!

## 2024-11-09 NOTE — Progress Notes (Signed)
 Careteam: Patient Care Team: Gil Greig BRAVO, NP as PCP - General (Adult Health Nurse Practitioner)  Seen by: Greig Gil, AGNP-C  PLACE OF SERVICE:  [facility]  Advanced Directive information    Allergies  Allergen Reactions   Ace Inhibitors Other (See Comments)    Cough   Diltiazem Hcl Other (See Comments)    Exacerbated acid reflux.    Chief Complaint  Patient presents with   Follow-up     HPI: Patient is a 78 y.o. male seen today for medical management of chronic conditions.   Discussed the use of AI scribe software for clinical note transcription with the patient, who gave verbal consent to proceed.  History of Present Illness   Peter Lam is a 78 year old male with vascular dementia and hypertension who presents for follow-up on memory medication and blood pressure management. He is accompanied by his brother, Peter Lam.  He is taking Aricept  without difficulty per patient. Unclear if he is having diarrhea. While his long-term memory remains intact, he struggles with short-term recall, such as remembering recent meals. He consulted a neurologist about six weeks ago. EEG was normal. Lab work including Alzheimer's marker was suggestive of AD. His mother had memory issues and died of Alzheimer's disease.  He is currently on amlodipine  10 mg for blood pressure management. Previously, he was on 5 mg, but due to elevated blood pressure, his dose was increased to 10 mg. His blood pressure has improved from 160/70 to 138/88.  He maintains an active lifestyle, walking approximately three miles daily on a trail near his home. He is independent in daily activities, including dressing and using the bathroom. He sings in the church choir and plays the harmonica. He is sleeping during the night. Brother reports periods of increased anxiety which turns to agitation. This typically occurs when family tries to help him with bathing.   His potassium levels were previously low, and he was  given potassium supplements, which he does not recall taking. Brother states he completed medication when his sister was watching him.   He lives with his brother, Peter Lam, after having issues with a previous landlord. He is not followed by [facility] social worker anymore. He does not have a smartphone, but Peter Lam does, and he has arranged for Peter Lam's number to be the primary contact for medical reminders.     No recent falls or injuries.   Weight unchanged from last encounter.   No recent falls or injuries.   Review of Systems:  Review of Systems  Unable to perform ROS: Dementia    Past Medical History:  Diagnosis Date   Arthritis    BPH (benign prostatic hypertrophy) with urinary obstruction    followed by Dr. Watt at [facility]   Cancer [facility])    Prostate cancer   GERD (gastroesophageal reflux disease)    Hx of adenomatous colonic polyps 05/07/2017   Hypertension    Inguinal hernia    Prostate cancer [facility])    Past Surgical History:  Procedure Laterality Date   INGUINAL HERNIA REPAIR  01/15/2012   Procedure: HERNIA REPAIR INGUINAL ADULT;  Surgeon: Krystal CHRISTELLA Spinner, MD;  Location: [facility];  Service: General;  Laterality: Left;  Left inguinal hernia with mesh    LYMPHADENECTOMY Bilateral 09/06/2017   Procedure: PELVIC LYMPHADENECTOMY;  Surgeon: Renda Glance, MD;  Location: [facility];  Service: Urology;  Laterality: Bilateral;   MULTIPLE TOOTH EXTRACTIONS     PROSTATE BIOPSY  ROBOT ASSISTED LAPAROSCOPIC RADICAL PROSTATECTOMY N/A 09/06/2017   Procedure: XI ROBOTIC ASSISTED LAPAROSCOPIC RADICAL PROSTATECTOMY LEVEL 2;  Surgeon: Renda Glance, MD;  Location: [facility];  Service: Urology;  Laterality: N/A;   TONSILLECTOMY AND ADENOIDECTOMY     Social History:   reports that he quit smoking about 50 years ago. His smoking use included cigarettes. He started smoking about 70 years ago. He has a 20 pack-year smoking history. He has never used smokeless tobacco. He  reports that he does not drink alcohol and does not use drugs.  Family History  Problem Relation Age of Onset   Alzheimer's disease Mother    Hypertension Father    Hypertension Brother    Cancer Maternal Uncle        colon   Colon cancer Maternal Uncle    Hypertension Sister    Throat cancer Other    Cancer Maternal Uncle        throat   Cancer Maternal Uncle        prostate    Medications: Patient's Medications  New Prescriptions   No medications on file  Previous Medications   AMLODIPINE  (NORVASC ) 10 MG TABLET    Take 1 tablet (10 mg total) by mouth daily.   AMLODIPINE  (NORVASC ) 5 MG TABLET    Take 5 mg by mouth daily.   DONEPEZIL  (ARICEPT ) 5 MG TABLET    Take 1 tablet (5 mg total) by mouth at bedtime.   LOSARTAN  (COZAAR ) 50 MG TABLET    Take 1 tablet (50 mg total) by mouth daily.   POTASSIUM CHLORIDE  SA (KLOR-CON  M) 20 MEQ TABLET    Take 1 tablet (20 mEq total) by mouth daily for 4 days.  Modified Medications   No medications on file  Discontinued Medications   No medications on file    Physical Exam:  Vitals:   11/09/24 1242  BP: 138/88  Pulse: 97  Temp: 99.8 F (37.7 C)  SpO2: 98%  Weight: 138 lb 3.2 oz (62.7 kg)  Height: 5' 10 (1.778 m)   Body mass index is 19.83 kg/m. Wt Readings from Last 3 Encounters:  11/09/24 138 lb 3.2 oz (62.7 kg)  10/02/24 133 lb 9.6 oz (60.6 kg)  09/28/24 138 lb 3.2 oz (62.7 kg)    Physical Exam Vitals reviewed.  Constitutional:      General: He is not in acute distress. HENT:     Head: Normocephalic.  Eyes:     General:        Right eye: No discharge.        Left eye: No discharge.  Cardiovascular:     Rate and Rhythm: Normal rate and regular rhythm.     Pulses: Normal pulses.     Heart sounds: Normal heart sounds.  Pulmonary:     Effort: Pulmonary effort is normal.     Breath sounds: Normal breath sounds.  Abdominal:     General: Bowel sounds are normal.     Palpations: Abdomen is soft.  Musculoskeletal:      Cervical back: Neck supple.     Right lower leg: No edema.     Left lower leg: No edema.  Skin:    General: Skin is warm.     Capillary Refill: Capillary refill takes less than 2 seconds.  Neurological:     General: No focal deficit present.     Mental Status: He is alert. Mental status is at baseline.     Gait: Gait normal.  Psychiatric:  Mood and Affect: Mood normal.     Labs reviewed: Basic Metabolic Panel: Recent Labs    09/28/24 1409 10/02/24 1553  NA 144 143  K 3.3* 4.5  CL 105 105  CO2 31 25  GLUCOSE 105* 89  BUN 5* 11  CREATININE 0.90 0.86  CALCIUM 8.9 9.1  TSH  --  1.390   Liver Function Tests: Recent Labs    09/28/24 1409 10/02/24 1553  AST 11 18  ALT 11 20  ALKPHOS  --  124*  BILITOT 0.5 0.4  PROT 5.9* 6.4  ALBUMIN  --  4.4   No results for input(s): LIPASE, AMYLASE in the last 8760 hours. No results for input(s): AMMONIA in the last 8760 hours. CBC: Recent Labs    09/28/24 1409 10/02/24 1553  WBC 5.4 8.2  NEUTROABS 3,748 6.4  HGB 13.5 15.7  HCT 41.1 48.5  MCV 93.8 96  PLT 164 202   Lipid Panel: No results for input(s): CHOL, HDL, LDLCALC, TRIG, CHOLHDL, LDLDIRECT in the last 8760 hours. TSH: Recent Labs    10/02/24 1553  TSH 1.390   A1C: Lab Results  Component Value Date   HGBA1C 5.1 05/15/2016     Assessment/Plan 1. Immunization due (Primary) - Pneumococcal conjugate vaccine 20-valent (Prevnar 20)  2. Moderate dementia with mood disturbance, unspecified dementia type (HCC) - followed by neurology - recent EEG normal - Alzheimer's labs suggestive of disease or vascular dementia - Aricept  started last encounter - unclear if SE - consider increasing Aricept  to 10 mg next encounter  - will start zoloft for anxiety/agitation - sertraline (ZOLOFT) 25 MG tablet; Take 1 tablet (25 mg total) by mouth daily.  Dispense: 30 tablet; Refill: 3 - donepezil  (ARICEPT ) 5 MG tablet; Take 1 tablet (5 mg total) by  mouth at bedtime.  Dispense: 90 tablet; Refill: 2  3. Essential hypertension - improved, goal, < 140/80 - BUN/creat 11/0.86 10/02/2024 - EKG NSR 02/2021 - amLODipine  (NORVASC ) 10 MG tablet; Take 1 tablet (10 mg total) by mouth daily.  Dispense: 90 tablet; Refill: 1 - losartan  (COZAAR ) 50 MG tablet; Take 1 tablet (50 mg total) by mouth daily.  Dispense: 90 tablet; Refill: 1  4. Hypokalemia - K+ 3.3 10/23> rechecked 4.5 - Basic Metabolic Panel with eGFR  5. Mixed hyperlipidemia - total 200, LDL 134 02/2021 - need fasting lipid panel next encounter  - Lipid Panel; Future  Total time: 35 minutes. Greater than 50% of total time spent doing patient education regarding health maintenance, dementia, HTN, anxiety and HLD including symptom/medication management.    Next appt: Mariah Harn FORBES Cluster, NP  Sayla Golonka Cluster BODILY  [facility] [phone]

## 2024-11-10 ENCOUNTER — Ambulatory Visit: Payer: Self-pay | Admitting: Orthopedic Surgery

## 2024-11-10 LAB — BASIC METABOLIC PANEL WITHOUT GFR
BUN: 13 mg/dL (ref 7–25)
CO2: 29 mmol/L (ref 20–32)
Calcium: 9 mg/dL (ref 8.6–10.3)
Chloride: 104 mmol/L (ref 98–110)
Creat: 1.04 mg/dL (ref 0.70–1.28)
Glucose, Bld: 121 mg/dL (ref 65–139)
Potassium: 4.1 mmol/L (ref 3.5–5.3)
Sodium: 141 mmol/L (ref 135–146)

## 2024-12-12 ENCOUNTER — Other Ambulatory Visit (HOSPITAL_COMMUNITY): Payer: Self-pay

## 2025-01-12 ENCOUNTER — Other Ambulatory Visit (HOSPITAL_COMMUNITY): Payer: Self-pay

## 2025-02-08 ENCOUNTER — Ambulatory Visit: Admitting: Orthopedic Surgery
